# Patient Record
Sex: Female | Born: 1975 | Race: Black or African American | Hispanic: No | Marital: Married | Smoking: Current every day smoker
Health system: Southern US, Community
[De-identification: ages and names within clinical notes are randomized; demographics above are authoritative.]

## PROBLEM LIST (undated history)

## (undated) DIAGNOSIS — Z720 Tobacco use: Secondary | ICD-10-CM

## (undated) DIAGNOSIS — I10 Essential (primary) hypertension: Secondary | ICD-10-CM

## (undated) DIAGNOSIS — F32A Depression, unspecified: Secondary | ICD-10-CM

## (undated) DIAGNOSIS — D649 Anemia, unspecified: Secondary | ICD-10-CM

## (undated) DIAGNOSIS — F419 Anxiety disorder, unspecified: Secondary | ICD-10-CM

## (undated) DIAGNOSIS — D219 Benign neoplasm of connective and other soft tissue, unspecified: Secondary | ICD-10-CM

## (undated) HISTORY — DX: Tobacco use: Z72.0

---

## 2010-08-26 DIAGNOSIS — D219 Benign neoplasm of connective and other soft tissue, unspecified: Secondary | ICD-10-CM

## 2010-08-26 HISTORY — DX: Benign neoplasm of connective and other soft tissue, unspecified: D21.9

## 2011-02-20 ENCOUNTER — Other Ambulatory Visit: Payer: Self-pay | Admitting: Obstetrics and Gynecology

## 2011-02-20 DIAGNOSIS — N6310 Unspecified lump in the right breast, unspecified quadrant: Secondary | ICD-10-CM

## 2011-03-01 ENCOUNTER — Other Ambulatory Visit: Payer: Self-pay

## 2011-03-12 ENCOUNTER — Ambulatory Visit
Admission: RE | Admit: 2011-03-12 | Discharge: 2011-03-12 | Disposition: A | Discharge status: Home / Self Care | Payer: BC Managed Care – PPO | Source: Ambulatory Visit | Attending: Obstetrics and Gynecology | Admitting: Obstetrics and Gynecology

## 2011-03-12 ENCOUNTER — Other Ambulatory Visit: Payer: Self-pay | Admitting: Obstetrics and Gynecology

## 2011-03-12 DIAGNOSIS — N6310 Unspecified lump in the right breast, unspecified quadrant: Secondary | ICD-10-CM

## 2011-03-13 ENCOUNTER — Other Ambulatory Visit: Payer: BC Managed Care – PPO

## 2011-03-22 ENCOUNTER — Inpatient Hospital Stay: Admission: RE | Admit: 2011-03-22 | Payer: BC Managed Care – PPO | Source: Ambulatory Visit

## 2011-03-29 ENCOUNTER — Ambulatory Visit
Admission: RE | Admit: 2011-03-29 | Discharge: 2011-03-29 | Disposition: A | Discharge status: Home / Self Care | Payer: BC Managed Care – PPO | Source: Ambulatory Visit | Attending: Obstetrics and Gynecology | Admitting: Obstetrics and Gynecology

## 2011-03-29 ENCOUNTER — Other Ambulatory Visit: Payer: Self-pay | Admitting: Diagnostic Radiology

## 2011-03-29 DIAGNOSIS — N6310 Unspecified lump in the right breast, unspecified quadrant: Secondary | ICD-10-CM

## 2014-08-30 ENCOUNTER — Other Ambulatory Visit: Payer: Self-pay | Admitting: Obstetrics

## 2014-08-30 DIAGNOSIS — N63 Unspecified lump in unspecified breast: Secondary | ICD-10-CM

## 2014-09-06 ENCOUNTER — Other Ambulatory Visit (HOSPITAL_COMMUNITY): Payer: Self-pay | Admitting: Obstetrics

## 2014-09-06 DIAGNOSIS — R102 Pelvic and perineal pain: Secondary | ICD-10-CM

## 2014-09-07 ENCOUNTER — Ambulatory Visit
Admission: RE | Admit: 2014-09-07 | Discharge: 2014-09-07 | Disposition: A | Discharge status: Home / Self Care | Payer: Medicaid Other | Source: Ambulatory Visit | Attending: Obstetrics | Admitting: Obstetrics

## 2014-09-07 DIAGNOSIS — N63 Unspecified lump in unspecified breast: Secondary | ICD-10-CM

## 2014-09-09 ENCOUNTER — Ambulatory Visit (HOSPITAL_COMMUNITY)
Admission: RE | Admit: 2014-09-09 | Discharge: 2014-09-09 | Disposition: A | Discharge status: Home / Self Care | Payer: Medicaid Other | Source: Ambulatory Visit | Attending: Obstetrics | Admitting: Obstetrics

## 2014-09-09 DIAGNOSIS — R102 Pelvic and perineal pain: Secondary | ICD-10-CM | POA: Insufficient documentation

## 2014-09-09 DIAGNOSIS — N852 Hypertrophy of uterus: Secondary | ICD-10-CM | POA: Diagnosis not present

## 2014-09-26 ENCOUNTER — Other Ambulatory Visit: Payer: Self-pay | Admitting: Obstetrics

## 2014-09-26 DIAGNOSIS — N938 Other specified abnormal uterine and vaginal bleeding: Secondary | ICD-10-CM

## 2014-10-04 ENCOUNTER — Ambulatory Visit
Admission: RE | Admit: 2014-10-04 | Discharge: 2014-10-04 | Disposition: A | Discharge status: Home / Self Care | Payer: Medicaid Other | Source: Ambulatory Visit | Attending: Obstetrics | Admitting: Obstetrics

## 2014-10-04 DIAGNOSIS — N938 Other specified abnormal uterine and vaginal bleeding: Secondary | ICD-10-CM

## 2014-10-04 HISTORY — PX: IR GENERIC HISTORICAL: IMG1180011

## 2014-10-04 HISTORY — DX: Anemia, unspecified: D64.9

## 2014-10-04 HISTORY — DX: Benign neoplasm of connective and other soft tissue, unspecified: D21.9

## 2014-10-04 NOTE — Consult Note (Signed)
Chief Complaint: Chief Complaint  Patient presents with  . Fibroids    Consult for [place]    Referring Physician(s): Marshall,Bernard A  History of Present Illness: Pamela Pace is a 39 y.o. female with a long history of menorrhagia and pelvic cramping. She also describes pelvic bulk symptoms such as fullness and constipation. She just recently received a depo provera injection, three weeks ago, and has improvement in the bleeding symptoms. Her Hb was reportedly 9. She has no future pregnancy plans. She denies a history of PID, pelvic radiation, and pelvic malignancy. She denies previous intervention for fibroids.  Past Medical History  Diagnosis Date  . Fibroids 2012  . Anemia   HTN Gestational HTN and DM  Past Surgical History  Procedure Laterality Date  . Cesarean section  2008    Allergies: Other, all seafood/  Medications: Prior to Admission medications   Medication Sig Start Date End Date Taking? Authorizing Provider  chlorthalidone (HYGROTEN) 100 MG tablet Take 12.5 mg by mouth daily.   Yes Historical Provider, MD    No family history on file.  History   Social History  . Marital Status: Married    Spouse Name: N/A    Number of Children: N/A  . Years of Education: N/A   Social History Main Topics  . Smoking status: Light Tobacco Smoker  . Smokeless tobacco: Not on file  . Alcohol Use: Not on file  . Drug Use: Not on file  . Sexual Activity: Not on file   Other Topics Concern  . Not on file   Social History Narrative  . No narrative on file     Review of Systems: A 12 point ROS discussed and pertinent positives are indicated in the HPI above.  All other systems are negative.  Review of Systems  Vital Signs: BP 131/76 mmHg  Pulse 85  Temp(Src) 98.5 F (36.9 C) (Oral)  Resp 14  Ht 5\' 3"  (1.6 m)  Wt 148 lb (67.132 kg)  BMI 26.22 kg/m2  SpO2 100%  LMP 08/24/2014 (Exact Date)  Physical Exam  Constitutional: She is oriented to person,  place, and time. She appears well-developed and well-nourished.  HENT:  Head: Normocephalic and atraumatic.  Cardiovascular: Normal rate, regular rhythm and normal heart sounds.   Pulmonary/Chest: Effort normal and breath sounds normal.  Abdominal: Soft.  Musculoskeletal: Normal range of motion.  R fem/DP/PT pulses 2+  Neurological: She is alert and oriented to person, place, and time.  Skin: Skin is warm and dry.    Imaging: US Transvaginal Non-ob  09/09/2014   CLINICAL DATA:  Pelvic pain, enlarged uterus at physical exam  EXAM: TRANSABDOMINAL AND TRANSVAGINAL ULTRASOUND OF PELVIS  TECHNIQUE: Both transabdominal and transvaginal ultrasound examinations of the pelvis were performed. Transabdominal technique was performed for global imaging of the pelvis including uterus, ovaries, adnexal regions, and pelvic cul-de-sac. It was necessary to proceed with endovaginal exam following the transabdominal exam to visualize the endometrium and ovaries.  COMPARISON:  None  FINDINGS: Uterus  Measurements: 14.7 x 9.2 x 12 cm. Anteverted, retroflexed. Multiple uterine fibroids are noted, rendering visualization of the uterine fundus suboptimal. Left lateral uterine body intramural fibroid measures 6.0 x 5.8 x 5.6 cm.  Right fundal intramural fibroid measures 6.0 x 5.5 x 5.0 cm.  Right lateral uterine body fibroid measures 7.1 x 6.0 x 5.6 cm.  Endometrium  Thickness: 4 mm.  No focal abnormality visualized.  Right ovary  Measurements: 2.3 x 2.1 x 1.5 cm.  Normal appearance/no adnexal mass.  Left ovary  Measurements: 4.6 x 4.0 x 3.3 cm. Probable resolving hemorrhagic cyst with cobweb like peripheral retractile internal echoes measures 3.0 x 2.9 x 2.4 cm, likely incidental unless the patient has left-sided pelvic pain.  Other findings  No free fluid  IMPRESSION: Uterine fibroids as above.  Likely incidental left ovarian cyst. If the patient has left-sided pelvic pain, consider reimaging in 4-6 weeks during the week  following the patient's menses to document resolution.   Electronically Signed   By: Conchita Paris M.D.   On: 09/09/2014 15:02   US Pelvis Complete  09/09/2014   CLINICAL DATA:  Pelvic pain, enlarged uterus at physical exam  EXAM: TRANSABDOMINAL AND TRANSVAGINAL ULTRASOUND OF PELVIS  TECHNIQUE: Both transabdominal and transvaginal ultrasound examinations of the pelvis were performed. Transabdominal technique was performed for global imaging of the pelvis including uterus, ovaries, adnexal regions, and pelvic cul-de-sac. It was necessary to proceed with endovaginal exam following the transabdominal exam to visualize the endometrium and ovaries.  COMPARISON:  None  FINDINGS: Uterus  Measurements: 14.7 x 9.2 x 12 cm. Anteverted, retroflexed. Multiple uterine fibroids are noted, rendering visualization of the uterine fundus suboptimal. Left lateral uterine body intramural fibroid measures 6.0 x 5.8 x 5.6 cm.  Right fundal intramural fibroid measures 6.0 x 5.5 x 5.0 cm.  Right lateral uterine body fibroid measures 7.1 x 6.0 x 5.6 cm.  Endometrium  Thickness: 4 mm.  No focal abnormality visualized.  Right ovary  Measurements: 2.3 x 2.1 x 1.5 cm. Normal appearance/no adnexal mass.  Left ovary  Measurements: 4.6 x 4.0 x 3.3 cm. Probable resolving hemorrhagic cyst with cobweb like peripheral retractile internal echoes measures 3.0 x 2.9 x 2.4 cm, likely incidental unless the patient has left-sided pelvic pain.  Other findings  No free fluid  IMPRESSION: Uterine fibroids as above.  Likely incidental left ovarian cyst. If the patient has left-sided pelvic pain, consider reimaging in 4-6 weeks during the week following the patient's menses to document resolution.   Electronically Signed   By: Conchita Paris M.D.   On: 09/09/2014 15:02   Mm Digital Diagnostic Bilat  09/07/2014   CLINICAL DATA:  Follow-up benign right breast biopsy and annual  EXAM: DIGITAL DIAGNOSTIC  BILATERAL MAMMOGRAM WITH CAD  ULTRASOUND RIGHT  BREAST  COMPARISON:  03/12/2011  ACR Breast Density Category c: The breast tissue is heterogeneously dense, which may obscure small masses.  FINDINGS: A metallic biopsy marker clip is noted within the subareolar right breast. A few scattered benign-appearing calcifications are noted bilaterally. An asymmetry identified in the medial right breast persists on additional imaging and may correspond to a vessel. The breasts are without definite suspicious mass, architectural distortion or malignant type calcifications.  Mammographic images were processed with CAD.  On physical exam, no suspicious mass is palpated on examination.  A targeted right breast ultrasound was performed of the medial right breast which demonstrates only normal fibroglandular tissue. No suspicious mass, shadowing or fluid collection was identified.  IMPRESSION: No mammographic or sonographic evidence of malignancy.  RECOMMENDATION: Recommend annual screening mammograms. The patient should return sooner if clinically indicated.  I have discussed the findings and recommendations with the patient. Results were also provided in writing at the conclusion of the visit. If applicable, a reminder letter will be sent to the patient regarding the next appointment.  BI-RADS CATEGORY  2: Benign.   Electronically Signed   By: Rosemarie Ax   On: 09/07/2014 07:59  US Breast Ltd Uni Right Inc Axilla  09/07/2014   CLINICAL DATA:  Follow-up benign right breast biopsy and annual  EXAM: DIGITAL DIAGNOSTIC  BILATERAL MAMMOGRAM WITH CAD  ULTRASOUND RIGHT BREAST  COMPARISON:  03/12/2011  ACR Breast Density Category c: The breast tissue is heterogeneously dense, which may obscure small masses.  FINDINGS: A metallic biopsy marker clip is noted within the subareolar right breast. A few scattered benign-appearing calcifications are noted bilaterally. An asymmetry identified in the medial right breast persists on additional imaging and may correspond to a vessel. The  breasts are without definite suspicious mass, architectural distortion or malignant type calcifications.  Mammographic images were processed with CAD.  On physical exam, no suspicious mass is palpated on examination.  A targeted right breast ultrasound was performed of the medial right breast which demonstrates only normal fibroglandular tissue. No suspicious mass, shadowing or fluid collection was identified.  IMPRESSION: No mammographic or sonographic evidence of malignancy.  RECOMMENDATION: Recommend annual screening mammograms. The patient should return sooner if clinically indicated.  I have discussed the findings and recommendations with the patient. Results were also provided in writing at the conclusion of the visit. If applicable, a reminder letter will be sent to the patient regarding the next appointment.  BI-RADS CATEGORY  2: Benign.   Electronically Signed   By: Rosemarie Ax   On: 09/07/2014 07:59    Labs:  CBC: No results for input(s): WBC, HGB, HCT, PLT in the last 8760 hours.  COAGS: No results for input(s): INR, APTT in the last 8760 hours.  BMP: No results for input(s): NA, K, CL, CO2, GLUCOSE, BUN, CALCIUM, CREATININE, GFRNONAA, GFRAA in the last 8760 hours.  Invalid input(s): CMP  LIVER FUNCTION TESTS: No results for input(s): BILITOT, AST, ALT, ALKPHOS, PROT, ALBUMIN in the last 8760 hours.  TUMOR MARKERS: No results for input(s): AFPTM, CEA, CA199, CHROMGRNA in the last 8760 hours.  Assessment and Plan:  Mrs. Bronaugh has symptomatic fibroids, by [place], and is a candidate for [place]. She consents to progressing through the workup for [place] with an MRI. Her questions were answered.  Thank you for this interesting consult.  I greatly enjoyed meeting Theola Cuellar and look forward to participating in their care.  Signed: Danahi Reddish, ART A 10/04/2014, 3:43 PM   I spent a total of 45 minutes face to face in clinical consultation, greater than 50% of which was counseling/coordinating  care for [place]

## 2014-10-12 ENCOUNTER — Other Ambulatory Visit: Payer: Self-pay | Admitting: Obstetrics

## 2014-10-12 DIAGNOSIS — D259 Leiomyoma of uterus, unspecified: Secondary | ICD-10-CM

## 2014-10-22 ENCOUNTER — Ambulatory Visit
Admission: RE | Admit: 2014-10-22 | Discharge: 2014-10-22 | Disposition: A | Discharge status: Home / Self Care | Payer: Medicaid Other | Source: Ambulatory Visit | Attending: Obstetrics | Admitting: Obstetrics

## 2014-10-22 DIAGNOSIS — D259 Leiomyoma of uterus, unspecified: Secondary | ICD-10-CM

## 2014-10-22 MED ORDER — GADOBENATE DIMEGLUMINE 529 MG/ML IV SOLN
13.0000 mL | Freq: Once | INTRAVENOUS | Status: AC | PRN
  Administered 2014-10-22: 13 mL via INTRAVENOUS

## 2014-12-12 ENCOUNTER — Other Ambulatory Visit: Payer: Self-pay | Admitting: Radiology

## 2014-12-13 ENCOUNTER — Other Ambulatory Visit: Payer: Self-pay | Admitting: Radiology

## 2014-12-14 ENCOUNTER — Ambulatory Visit (HOSPITAL_COMMUNITY)
Admission: RE | Admit: 2014-12-14 | Discharge: 2014-12-14 | Disposition: A | Discharge status: Home / Self Care | Payer: Medicaid Other | Source: Ambulatory Visit | Attending: Interventional Radiology | Admitting: Interventional Radiology

## 2014-12-14 ENCOUNTER — Encounter (HOSPITAL_COMMUNITY): Payer: Self-pay

## 2014-12-14 ENCOUNTER — Observation Stay (HOSPITAL_COMMUNITY)
Admission: RE | Admit: 2014-12-14 | Discharge: 2014-12-15 | Disposition: A | Discharge status: Home / Self Care | Payer: Medicaid Other | Source: Ambulatory Visit | Attending: Interventional Radiology | Admitting: Interventional Radiology

## 2014-12-14 VITALS — BP 117/76 | HR 80 | Temp 98.5°F | Resp 16 | Ht 63.0 in | Wt 152.0 lb

## 2014-12-14 DIAGNOSIS — D259 Leiomyoma of uterus, unspecified: Secondary | ICD-10-CM | POA: Diagnosis not present

## 2014-12-14 DIAGNOSIS — F17210 Nicotine dependence, cigarettes, uncomplicated: Secondary | ICD-10-CM | POA: Diagnosis not present

## 2014-12-14 DIAGNOSIS — D219 Benign neoplasm of connective and other soft tissue, unspecified: Secondary | ICD-10-CM | POA: Diagnosis present

## 2014-12-14 DIAGNOSIS — N920 Excessive and frequent menstruation with regular cycle: Secondary | ICD-10-CM | POA: Diagnosis present

## 2014-12-14 LAB — CBC WITH DIFFERENTIAL/PLATELET
Basophils Absolute: 0 10*3/uL (ref 0.0–0.1)
Basophils Relative: 0 % (ref 0–1)
Eosinophils Absolute: 0.1 10*3/uL (ref 0.0–0.7)
Eosinophils Relative: 2 % (ref 0–5)
HEMATOCRIT: 39.2 % (ref 36.0–46.0)
HEMOGLOBIN: 12.2 g/dL (ref 12.0–15.0)
LYMPHS ABS: 2.6 10*3/uL (ref 0.7–4.0)
Lymphocytes Relative: 38 % (ref 12–46)
MCH: 25.5 pg — ABNORMAL LOW (ref 26.0–34.0)
MCHC: 31.1 g/dL (ref 30.0–36.0)
MCV: 82 fL (ref 78.0–100.0)
MONOS PCT: 7 % (ref 3–12)
Monocytes Absolute: 0.5 10*3/uL (ref 0.1–1.0)
Neutro Abs: 3.7 10*3/uL (ref 1.7–7.7)
Neutrophils Relative %: 53 % (ref 43–77)
Platelets: 338 10*3/uL (ref 150–400)
RBC: 4.78 MIL/uL (ref 3.87–5.11)
RDW: 20 % — ABNORMAL HIGH (ref 11.5–15.5)
WBC: 6.9 10*3/uL (ref 4.0–10.5)

## 2014-12-14 LAB — BASIC METABOLIC PANEL
ANION GAP: 10 (ref 5–15)
BUN: 12 mg/dL (ref 6–23)
CHLORIDE: 104 mmol/L (ref 96–112)
CO2: 25 mmol/L (ref 19–32)
Calcium: 8.8 mg/dL (ref 8.4–10.5)
Creatinine, Ser: 0.71 mg/dL (ref 0.50–1.10)
GFR calc non Af Amer: >90 mL/min (ref >90)
Glucose, Bld: 101 mg/dL — ABNORMAL HIGH (ref 70–99)
POTASSIUM: 3 mmol/L — AB (ref 3.5–5.1)
SODIUM: 139 mmol/L (ref 135–145)

## 2014-12-14 LAB — HCG, SERUM, QUALITATIVE: PREG SERUM: NEGATIVE

## 2014-12-14 LAB — APTT: APTT: 28 s (ref 24–37)

## 2014-12-14 LAB — PROTIME-INR
INR: 0.95 (ref 0.00–1.49)
Prothrombin Time: 12.8 seconds (ref 11.6–15.2)

## 2014-12-14 MED ORDER — IOHEXOL 300 MG/ML  SOLN
50.0000 mL | Freq: Once | INTRAMUSCULAR | Status: AC | PRN
  Administered 2014-12-14: 1 mL via INTRA_ARTERIAL

## 2014-12-14 MED ORDER — NITROGLYCERIN 1 MG/10 ML FOR IR/CATH LAB
INTRA_ARTERIAL | Status: AC | PRN
  Administered 2014-12-14: 100 ug via INTRA_ARTERIAL

## 2014-12-14 MED ORDER — LIDOCAINE HCL 1 % IJ SOLN
INTRAMUSCULAR | Status: AC
  Filled 2014-12-14: qty 20

## 2014-12-14 MED ORDER — ADULT MULTIVITAMIN W/MINERALS CH
1.0000 | ORAL_TABLET | Freq: Every day | ORAL | Status: DC
  Administered 2014-12-14 – 2014-12-15 (×2): 1 via ORAL
  Filled 2014-12-14 (×2): qty 1

## 2014-12-14 MED ORDER — POTASSIUM CHLORIDE 10 MEQ/100ML IV SOLN
10.0000 meq | INTRAVENOUS | Status: AC
  Administered 2014-12-14 (×2): 10 meq via INTRAVENOUS
  Filled 2014-12-14 (×2): qty 100

## 2014-12-14 MED ORDER — SODIUM CHLORIDE 0.9 % IJ SOLN
3.0000 mL | Freq: Two times a day (BID) | INTRAMUSCULAR | Status: DC
  Administered 2014-12-14 – 2014-12-15 (×2): 3 mL via INTRAVENOUS

## 2014-12-14 MED ORDER — MIDAZOLAM HCL 2 MG/2ML IJ SOLN
INTRAMUSCULAR | Status: AC | PRN
  Administered 2014-12-14 (×4): 0.5 mg via INTRAVENOUS
  Administered 2014-12-14: 1 mg via INTRAVENOUS
  Administered 2014-12-14 (×2): 0.5 mg via INTRAVENOUS

## 2014-12-14 MED ORDER — DIPHENHYDRAMINE HCL 50 MG PO CAPS
50.0000 mg | ORAL_CAPSULE | Freq: Once | ORAL | Status: AC
  Administered 2014-12-14: 50 mg via ORAL
  Filled 2014-12-14: qty 1

## 2014-12-14 MED ORDER — HYDROMORPHONE 0.3 MG/ML IV SOLN
INTRAVENOUS | Status: DC
  Administered 2014-12-14: 12:00:00 via INTRAVENOUS

## 2014-12-14 MED ORDER — CHLORTHALIDONE 25 MG PO TABS
12.5000 mg | ORAL_TABLET | Freq: Every day | ORAL | Status: DC
  Administered 2014-12-14: 12.5 mg via ORAL
  Filled 2014-12-14 (×2): qty 0.5

## 2014-12-14 MED ORDER — ONDANSETRON HCL 4 MG/2ML IJ SOLN: 4.0000 mg | Freq: Four times a day (QID) | INTRAMUSCULAR | Status: DC | PRN

## 2014-12-14 MED ORDER — MIDAZOLAM HCL 2 MG/2ML IJ SOLN
INTRAMUSCULAR | Status: AC
  Filled 2014-12-14: qty 6

## 2014-12-14 MED ORDER — CHLORHEXIDINE GLUCONATE 0.12 % MT SOLN
15.0000 mL | Freq: Two times a day (BID) | OROMUCOSAL | Status: DC
Start: 2014-12-14 — End: 2014-12-15
  Administered 2014-12-14 – 2014-12-15 (×2): 15 mL via OROMUCOSAL
  Filled 2014-12-14 (×4): qty 15

## 2014-12-14 MED ORDER — CEFAZOLIN SODIUM-DEXTROSE 2-3 GM-% IV SOLR
2.0000 g | INTRAVENOUS | Status: AC
  Administered 2014-12-14: 2 g via INTRAVENOUS
  Filled 2014-12-14: qty 50

## 2014-12-14 MED ORDER — HYDROMORPHONE 0.3 MG/ML IV SOLN
INTRAVENOUS | Status: AC
  Filled 2014-12-14: qty 25

## 2014-12-14 MED ORDER — DOCUSATE SODIUM 100 MG PO CAPS
100.0000 mg | ORAL_CAPSULE | Freq: Two times a day (BID) | ORAL | Status: DC
  Administered 2014-12-14 – 2014-12-15 (×2): 100 mg via ORAL
  Filled 2014-12-14 (×2): qty 1

## 2014-12-14 MED ORDER — NALOXONE HCL 0.4 MG/ML IJ SOLN: 0.4000 mg | INTRAMUSCULAR | Status: DC | PRN

## 2014-12-14 MED ORDER — PROMETHAZINE HCL 25 MG PO TABS: 25.0000 mg | ORAL_TABLET | Freq: Three times a day (TID) | ORAL | Status: DC | PRN

## 2014-12-14 MED ORDER — SODIUM CHLORIDE 0.9 % IJ SOLN: 3.0000 mL | INTRAMUSCULAR | Status: DC | PRN

## 2014-12-14 MED ORDER — PROMETHAZINE HCL 25 MG RE SUPP: 25.0000 mg | Freq: Three times a day (TID) | RECTAL | Status: DC | PRN

## 2014-12-14 MED ORDER — FERROUS FUMARATE 325 (106 FE) MG PO TABS
1.0000 | ORAL_TABLET | Freq: Every day | ORAL | Status: DC
  Administered 2014-12-14 – 2014-12-15 (×2): 106 mg via ORAL
  Filled 2014-12-14 (×2): qty 1

## 2014-12-14 MED ORDER — SODIUM CHLORIDE 0.9 % IV SOLN
INTRAVENOUS | Status: DC
  Administered 2014-12-14: 08:00:00 via INTRAVENOUS

## 2014-12-14 MED ORDER — SODIUM CHLORIDE 0.9 % IJ SOLN: 9.0000 mL | INTRAMUSCULAR | Status: DC | PRN

## 2014-12-14 MED ORDER — FENTANYL CITRATE (PF) 100 MCG/2ML IJ SOLN
INTRAMUSCULAR | Status: AC
  Filled 2014-12-14: qty 4

## 2014-12-14 MED ORDER — KETOROLAC TROMETHAMINE 30 MG/ML IJ SOLN
30.0000 mg | Freq: Once | INTRAMUSCULAR | Status: AC
  Administered 2014-12-14: 30 mg via INTRAVENOUS
  Filled 2014-12-14: qty 1

## 2014-12-14 MED ORDER — DIPHENHYDRAMINE HCL 50 MG/ML IJ SOLN: 12.5000 mg | Freq: Four times a day (QID) | INTRAMUSCULAR | Status: DC | PRN

## 2014-12-14 MED ORDER — FENTANYL CITRATE (PF) 100 MCG/2ML IJ SOLN
INTRAMUSCULAR | Status: AC | PRN
  Administered 2014-12-14 (×3): 25 ug via INTRAVENOUS
  Administered 2014-12-14: 50 ug via INTRAVENOUS
  Administered 2014-12-14: 25 ug via INTRAVENOUS

## 2014-12-14 MED ORDER — DIPHENHYDRAMINE HCL 12.5 MG/5ML PO ELIX
12.5000 mg | ORAL_SOLUTION | Freq: Four times a day (QID) | ORAL | Status: DC | PRN
  Filled 2014-12-14: qty 5

## 2014-12-14 MED ORDER — VITAMIN D3 25 MCG (1000 UNIT) PO TABS
1000.0000 [IU] | ORAL_TABLET | Freq: Every day | ORAL | Status: DC
  Administered 2014-12-14 – 2014-12-15 (×2): 1000 [IU] via ORAL
  Filled 2014-12-14 (×2): qty 1

## 2014-12-14 MED ORDER — CETYLPYRIDINIUM CHLORIDE 0.05 % MT LIQD
7.0000 mL | Freq: Two times a day (BID) | OROMUCOSAL | Status: DC
  Administered 2014-12-14: 7 mL via OROMUCOSAL

## 2014-12-14 MED ORDER — IBUPROFEN 800 MG PO TABS
800.0000 mg | ORAL_TABLET | Freq: Four times a day (QID) | ORAL | Status: DC
  Administered 2014-12-14 – 2014-12-15 (×5): 800 mg via ORAL
  Filled 2014-12-14 (×6): qty 1
  Filled 2014-12-14: qty 4

## 2014-12-14 MED ORDER — SODIUM CHLORIDE 0.9 % IV SOLN: 250.0000 mL | INTRAVENOUS | Status: DC | PRN

## 2014-12-14 MED ORDER — SODIUM CHLORIDE 0.9 % IV SOLN
INTRAVENOUS | Status: DC
  Administered 2014-12-14: 09:00:00 via INTRAVENOUS

## 2014-12-14 NOTE — Progress Notes (Signed)
93ml High concentration dose dilaudid PCA wasted in sink with Jeanie Cooks, RN.

## 2014-12-14 NOTE — Progress Notes (Signed)
Foley cath removed. Patient tolerated well, no discomfort at this time.

## 2014-12-14 NOTE — Progress Notes (Signed)
Subjective: Pt c/o itching from "head to toe"; denies pelvic pain,CP, dyspnea, N/V; lost IV access  Objective: Vital signs in last 24 hours: Temp:  [97.8 F (36.6 C)] 97.8 F (36.6 C) (04/20 1513) Pulse Rate:  [73-86] 77 (04/20 1513) Resp:  [10-19] 16 (04/20 1513) BP: (116-147)/(57-85) 119/57 mmHg (04/20 1513) SpO2:  [99 %-100 %] 100 % (04/20 1513)    Intake/Output from previous day:   Intake/Output this shift:    abd soft,+BS, NT, fibroid uterus; intact distal pulses; rt groin puncture site clean and dry,NT,no hematoma  Lab Results:   Recent Labs  12/14/14 0753  WBC 6.9  HGB 12.2  HCT 39.2  PLT 338   BMET  Recent Labs  12/14/14 0753  NA 139  K 3.0*  CL 104  CO2 25  GLUCOSE 101*  BUN 12  CREATININE 0.71  CALCIUM 8.8   PT/INR  Recent Labs  12/14/14 0753  LABPROT 12.8  INR 0.95   ABG No results for input(s): PHART, HCO3 in the last 72 hours.  Invalid input(s): PCO2, PO2  Studies/Results: Ir Angiogram Pelvis Selective Or Supraselective  12/14/2014   CLINICAL DATA:  Uterine fibroids  EXAM: UTERINE ARTERY EMBOLIZATION  FLUOROSCOPY TIME:  32 minutes and 12 seconds.  MEDICATIONS AND MEDICAL HISTORY: Versed 4 mg, Fentanyl 150 mcg.  As antibiotic prophylaxis, Ancef was ordered pre-procedure and administered intravenously within one hour of incision.  Allergies: Seafood  ANESTHESIA/SEDATION: Moderate sedation time: 82 minutes  CONTRAST:  50 cc Omnipaque 300  PROCEDURE: Vessels selected:  Iliac artery third order bilaterally.  The procedure, risks, benefits, and alternatives were explained to the patient. Questions regarding the procedure were encouraged and answered. The patient understands and consents to the procedure.  The right groin was prepped with Betadine in a sterile fashion, and a sterile drape was applied covering the operative field. A sterile gown and sterile gloves were used for the procedure.  A 19 gauge needle was inserted into the right common  femoral artery and removed over a Benson. A 5-French sheath was inserted. Cobra II catheter was advanced into the aorta. The contralateral left common iliac artery was selected. The internal iliac artery on the left was selected. 3D angiography was performed. A micro catheter was advanced over an 018 glide wire into the left uterine artery. Angiography was performed.  Embolization was performed utilizing2 vials 500-700 micron embospheres and 2/3 vial 700-900 micron embospheres.  Repeat angiogram was performed.  The micro catheter was removed and flushed. A Waltman's loop was created. The ipsilateral right common iliac artery and right internal iliac artery were selected. 3D angio was performed. A micro catheter was advanced over a 0018 glide wire into the right uterine artery. Angiography was performed.  Embolization was again performed with2 vials 500-700 micron embospheres.  Followup angiography was performed.  The micro catheter and Cobra catheter were removed. The sheath was removed. Hemostasis was achieved with direct pressure.  FINDINGS: Imaging demonstrates bilateral pelvic anatomy and bilateral uterine artery angiography. Expected anatomy is identified. No blood supply from the uterine artery to the vagina is visualized.  Post embolization angiography demonstrates relative stasis of flow and pruning of the vasculature to the uterus.  COMPLICATIONS: None  IMPRESSION: Successful bilateral uterine artery embolization.   Electronically Signed   By: Marybelle Killings M.D.   On: 12/14/2014 16:39   Ir Angiogram Pelvis Selective Or Supraselective  12/14/2014   CLINICAL DATA:  Uterine fibroids  EXAM: UTERINE ARTERY EMBOLIZATION  FLUOROSCOPY TIME:  32 minutes and 12 seconds.  MEDICATIONS AND MEDICAL HISTORY: Versed 4 mg, Fentanyl 150 mcg.  As antibiotic prophylaxis, Ancef was ordered pre-procedure and administered intravenously within one hour of incision.  Allergies: Seafood  ANESTHESIA/SEDATION: Moderate sedation  time: 82 minutes  CONTRAST:  50 cc Omnipaque 300  PROCEDURE: Vessels selected:  Iliac artery third order bilaterally.  The procedure, risks, benefits, and alternatives were explained to the patient. Questions regarding the procedure were encouraged and answered. The patient understands and consents to the procedure.  The right groin was prepped with Betadine in a sterile fashion, and a sterile drape was applied covering the operative field. A sterile gown and sterile gloves were used for the procedure.  A 19 gauge needle was inserted into the right common femoral artery and removed over a Benson. A 5-French sheath was inserted. Cobra II catheter was advanced into the aorta. The contralateral left common iliac artery was selected. The internal iliac artery on the left was selected. 3D angiography was performed. A micro catheter was advanced over an 018 glide wire into the left uterine artery. Angiography was performed.  Embolization was performed utilizing2 vials 500-700 micron embospheres and 2/3 vial 700-900 micron embospheres.  Repeat angiogram was performed.  The micro catheter was removed and flushed. A Waltman's loop was created. The ipsilateral right common iliac artery and right internal iliac artery were selected. 3D angio was performed. A micro catheter was advanced over a 0018 glide wire into the right uterine artery. Angiography was performed.  Embolization was again performed with2 vials 500-700 micron embospheres.  Followup angiography was performed.  The micro catheter and Cobra catheter were removed. The sheath was removed. Hemostasis was achieved with direct pressure.  FINDINGS: Imaging demonstrates bilateral pelvic anatomy and bilateral uterine artery angiography. Expected anatomy is identified. No blood supply from the uterine artery to the vagina is visualized.  Post embolization angiography demonstrates relative stasis of flow and pruning of the vasculature to the uterus.  COMPLICATIONS: None   IMPRESSION: Successful bilateral uterine artery embolization.   Electronically Signed   By: Marybelle Killings M.D.   On: 12/14/2014 16:39   Ir Angiogram Selective Each Additional Vessel  12/14/2014   CLINICAL DATA:  Uterine fibroids  EXAM: UTERINE ARTERY EMBOLIZATION  FLUOROSCOPY TIME:  32 minutes and 12 seconds.  MEDICATIONS AND MEDICAL HISTORY: Versed 4 mg, Fentanyl 150 mcg.  As antibiotic prophylaxis, Ancef was ordered pre-procedure and administered intravenously within one hour of incision.  Allergies: Seafood  ANESTHESIA/SEDATION: Moderate sedation time: 82 minutes  CONTRAST:  50 cc Omnipaque 300  PROCEDURE: Vessels selected:  Iliac artery third order bilaterally.  The procedure, risks, benefits, and alternatives were explained to the patient. Questions regarding the procedure were encouraged and answered. The patient understands and consents to the procedure.  The right groin was prepped with Betadine in a sterile fashion, and a sterile drape was applied covering the operative field. A sterile gown and sterile gloves were used for the procedure.  A 19 gauge needle was inserted into the right common femoral artery and removed over a Benson. A 5-French sheath was inserted. Cobra II catheter was advanced into the aorta. The contralateral left common iliac artery was selected. The internal iliac artery on the left was selected. 3D angiography was performed. A micro catheter was advanced over an 018 glide wire into the left uterine artery. Angiography was performed.  Embolization was performed utilizing2 vials 500-700 micron embospheres and 2/3 vial 700-900 micron embospheres.  Repeat angiogram was  performed.  The micro catheter was removed and flushed. A Waltman's loop was created. The ipsilateral right common iliac artery and right internal iliac artery were selected. 3D angio was performed. A micro catheter was advanced over a 0018 glide wire into the right uterine artery. Angiography was performed.  Embolization  was again performed with2 vials 500-700 micron embospheres.  Followup angiography was performed.  The micro catheter and Cobra catheter were removed. The sheath was removed. Hemostasis was achieved with direct pressure.  FINDINGS: Imaging demonstrates bilateral pelvic anatomy and bilateral uterine artery angiography. Expected anatomy is identified. No blood supply from the uterine artery to the vagina is visualized.  Post embolization angiography demonstrates relative stasis of flow and pruning of the vasculature to the uterus.  COMPLICATIONS: None  IMPRESSION: Successful bilateral uterine artery embolization.   Electronically Signed   By: Marybelle Killings M.D.   On: 12/14/2014 16:39   Ir Angiogram Selective Each Additional Vessel  12/14/2014   CLINICAL DATA:  Uterine fibroids  EXAM: UTERINE ARTERY EMBOLIZATION  FLUOROSCOPY TIME:  32 minutes and 12 seconds.  MEDICATIONS AND MEDICAL HISTORY: Versed 4 mg, Fentanyl 150 mcg.  As antibiotic prophylaxis, Ancef was ordered pre-procedure and administered intravenously within one hour of incision.  Allergies: Seafood  ANESTHESIA/SEDATION: Moderate sedation time: 82 minutes  CONTRAST:  50 cc Omnipaque 300  PROCEDURE: Vessels selected:  Iliac artery third order bilaterally.  The procedure, risks, benefits, and alternatives were explained to the patient. Questions regarding the procedure were encouraged and answered. The patient understands and consents to the procedure.  The right groin was prepped with Betadine in a sterile fashion, and a sterile drape was applied covering the operative field. A sterile gown and sterile gloves were used for the procedure.  A 19 gauge needle was inserted into the right common femoral artery and removed over a Benson. A 5-French sheath was inserted. Cobra II catheter was advanced into the aorta. The contralateral left common iliac artery was selected. The internal iliac artery on the left was selected. 3D angiography was performed. A micro  catheter was advanced over an 018 glide wire into the left uterine artery. Angiography was performed.  Embolization was performed utilizing2 vials 500-700 micron embospheres and 2/3 vial 700-900 micron embospheres.  Repeat angiogram was performed.  The micro catheter was removed and flushed. A Waltman's loop was created. The ipsilateral right common iliac artery and right internal iliac artery were selected. 3D angio was performed. A micro catheter was advanced over a 0018 glide wire into the right uterine artery. Angiography was performed.  Embolization was again performed with2 vials 500-700 micron embospheres.  Followup angiography was performed.  The micro catheter and Cobra catheter were removed. The sheath was removed. Hemostasis was achieved with direct pressure.  FINDINGS: Imaging demonstrates bilateral pelvic anatomy and bilateral uterine artery angiography. Expected anatomy is identified. No blood supply from the uterine artery to the vagina is visualized.  Post embolization angiography demonstrates relative stasis of flow and pruning of the vasculature to the uterus.  COMPLICATIONS: None  IMPRESSION: Successful bilateral uterine artery embolization.   Electronically Signed   By: Marybelle Killings M.D.   On: 12/14/2014 16:39   Ir 3d Primitivo Gauze Darreld Mclean  12/14/2014   CLINICAL DATA:  Uterine fibroids  EXAM: UTERINE ARTERY EMBOLIZATION  FLUOROSCOPY TIME:  32 minutes and 12 seconds.  MEDICATIONS AND MEDICAL HISTORY: Versed 4 mg, Fentanyl 150 mcg.  As antibiotic prophylaxis, Ancef was ordered pre-procedure and administered intravenously within one hour of  incision.  Allergies: Seafood  ANESTHESIA/SEDATION: Moderate sedation time: 82 minutes  CONTRAST:  50 cc Omnipaque 300  PROCEDURE: Vessels selected:  Iliac artery third order bilaterally.  The procedure, risks, benefits, and alternatives were explained to the patient. Questions regarding the procedure were encouraged and answered. The patient understands and  consents to the procedure.  The right groin was prepped with Betadine in a sterile fashion, and a sterile drape was applied covering the operative field. A sterile gown and sterile gloves were used for the procedure.  A 19 gauge needle was inserted into the right common femoral artery and removed over a Benson. A 5-French sheath was inserted. Cobra II catheter was advanced into the aorta. The contralateral left common iliac artery was selected. The internal iliac artery on the left was selected. 3D angiography was performed. A micro catheter was advanced over an 018 glide wire into the left uterine artery. Angiography was performed.  Embolization was performed utilizing2 vials 500-700 micron embospheres and 2/3 vial 700-900 micron embospheres.  Repeat angiogram was performed.  The micro catheter was removed and flushed. A Waltman's loop was created. The ipsilateral right common iliac artery and right internal iliac artery were selected. 3D angio was performed. A micro catheter was advanced over a 0018 glide wire into the right uterine artery. Angiography was performed.  Embolization was again performed with2 vials 500-700 micron embospheres.  Followup angiography was performed.  The micro catheter and Cobra catheter were removed. The sheath was removed. Hemostasis was achieved with direct pressure.  FINDINGS: Imaging demonstrates bilateral pelvic anatomy and bilateral uterine artery angiography. Expected anatomy is identified. No blood supply from the uterine artery to the vagina is visualized.  Post embolization angiography demonstrates relative stasis of flow and pruning of the vasculature to the uterus.  COMPLICATIONS: None  IMPRESSION: Successful bilateral uterine artery embolization.   Electronically Signed   By: Marybelle Killings M.D.   On: 12/14/2014 16:39   Ir US Guide Vasc Access Right  12/14/2014   CLINICAL DATA:  Uterine fibroids  EXAM: UTERINE ARTERY EMBOLIZATION  FLUOROSCOPY TIME:  32 minutes and 12  seconds.  MEDICATIONS AND MEDICAL HISTORY: Versed 4 mg, Fentanyl 150 mcg.  As antibiotic prophylaxis, Ancef was ordered pre-procedure and administered intravenously within one hour of incision.  Allergies: Seafood  ANESTHESIA/SEDATION: Moderate sedation time: 82 minutes  CONTRAST:  50 cc Omnipaque 300  PROCEDURE: Vessels selected:  Iliac artery third order bilaterally.  The procedure, risks, benefits, and alternatives were explained to the patient. Questions regarding the procedure were encouraged and answered. The patient understands and consents to the procedure.  The right groin was prepped with Betadine in a sterile fashion, and a sterile drape was applied covering the operative field. A sterile gown and sterile gloves were used for the procedure.  A 19 gauge needle was inserted into the right common femoral artery and removed over a Benson. A 5-French sheath was inserted. Cobra II catheter was advanced into the aorta. The contralateral left common iliac artery was selected. The internal iliac artery on the left was selected. 3D angiography was performed. A micro catheter was advanced over an 018 glide wire into the left uterine artery. Angiography was performed.  Embolization was performed utilizing2 vials 500-700 micron embospheres and 2/3 vial 700-900 micron embospheres.  Repeat angiogram was performed.  The micro catheter was removed and flushed. A Waltman's loop was created. The ipsilateral right common iliac artery and right internal iliac artery were selected. 3D angio was performed.  A micro catheter was advanced over a 0018 glide wire into the right uterine artery. Angiography was performed.  Embolization was again performed with2 vials 500-700 micron embospheres.  Followup angiography was performed.  The micro catheter and Cobra catheter were removed. The sheath was removed. Hemostasis was achieved with direct pressure.  FINDINGS: Imaging demonstrates bilateral pelvic anatomy and bilateral uterine  artery angiography. Expected anatomy is identified. No blood supply from the uterine artery to the vagina is visualized.  Post embolization angiography demonstrates relative stasis of flow and pruning of the vasculature to the uterus.  COMPLICATIONS: None  IMPRESSION: Successful bilateral uterine artery embolization.   Electronically Signed   By: Marybelle Killings M.D.   On: 12/14/2014 16:39   Ir Embo Tumor Organ Ischemia Infarct Inc Guide Roadmapping  12/14/2014   CLINICAL DATA:  Uterine fibroids  EXAM: UTERINE ARTERY EMBOLIZATION  FLUOROSCOPY TIME:  32 minutes and 12 seconds.  MEDICATIONS AND MEDICAL HISTORY: Versed 4 mg, Fentanyl 150 mcg.  As antibiotic prophylaxis, Ancef was ordered pre-procedure and administered intravenously within one hour of incision.  Allergies: Seafood  ANESTHESIA/SEDATION: Moderate sedation time: 82 minutes  CONTRAST:  50 cc Omnipaque 300  PROCEDURE: Vessels selected:  Iliac artery third order bilaterally.  The procedure, risks, benefits, and alternatives were explained to the patient. Questions regarding the procedure were encouraged and answered. The patient understands and consents to the procedure.  The right groin was prepped with Betadine in a sterile fashion, and a sterile drape was applied covering the operative field. A sterile gown and sterile gloves were used for the procedure.  A 19 gauge needle was inserted into the right common femoral artery and removed over a Benson. A 5-French sheath was inserted. Cobra II catheter was advanced into the aorta. The contralateral left common iliac artery was selected. The internal iliac artery on the left was selected. 3D angiography was performed. A micro catheter was advanced over an 018 glide wire into the left uterine artery. Angiography was performed.  Embolization was performed utilizing2 vials 500-700 micron embospheres and 2/3 vial 700-900 micron embospheres.  Repeat angiogram was performed.  The micro catheter was removed and  flushed. A Waltman's loop was created. The ipsilateral right common iliac artery and right internal iliac artery were selected. 3D angio was performed. A micro catheter was advanced over a 0018 glide wire into the right uterine artery. Angiography was performed.  Embolization was again performed with2 vials 500-700 micron embospheres.  Followup angiography was performed.  The micro catheter and Cobra catheter were removed. The sheath was removed. Hemostasis was achieved with direct pressure.  FINDINGS: Imaging demonstrates bilateral pelvic anatomy and bilateral uterine artery angiography. Expected anatomy is identified. No blood supply from the uterine artery to the vagina is visualized.  Post embolization angiography demonstrates relative stasis of flow and pruning of the vasculature to the uterus.  COMPLICATIONS: None  IMPRESSION: Successful bilateral uterine artery embolization.   Electronically Signed   By: Marybelle Killings M.D.   On: 12/14/2014 16:39    Anti-infectives: Anti-infectives    None      Assessment/Plan: s/p bilat [place] for symptomatic uterine fibroids; restart IV access; give po benadryl for itching; check am labs; f/u with Dr. Barbie Banner in [place] [facility] in 2-4 weeks     Selinda Korzeniewski,D [facility] 12/14/2014

## 2014-12-14 NOTE — Procedures (Signed)
[place] B No comp

## 2014-12-14 NOTE — Sedation Documentation (Signed)
5 fr. Sheath removed from right fem artery by Dr. Barbie Banner. Hemostasis achieved by manual pressure held by Lambert Mody, RTR for 15 minutes. Gauze and tegaderm applied to site. Groin level 0, RDP +3.

## 2014-12-14 NOTE — H&P (Signed)
Chief Complaint: Pelvic cramping, menorrhagia, uterine fibroids  Referring Physician(s): Hoss,Arthur  History of Present Illness: Pamela Pace is a 39 y.o. female with history of symptomatic uterine fibroids who presents today following recent IR consultation for elective bilateral uterine artery embolization.  Past Medical History  Diagnosis Date  . Fibroids 2012  . Anemia     Past Surgical History  Procedure Laterality Date  . Cesarean section  2008    Allergies: Other  Medications: Prior to Admission medications   Medication Sig Start Date End Date Taking? Authorizing Provider  chlorthalidone (HYGROTON) 25 MG tablet Take 12.5 mg by mouth at bedtime.   Yes Historical Provider, MD  cholecalciferol (VITAMIN D) 1000 UNITS tablet Take 1,000 Units by mouth daily.   Yes Historical Provider, MD  ferrous fumarate (HEMOCYTE - 106 MG FE) 325 (106 FE) MG TABS tablet Take 1 tablet by mouth daily.   Yes Historical Provider, MD  ibuprofen (ADVIL,MOTRIN) 200 MG tablet Take 400 mg by mouth every 6 (six) hours as needed for mild pain.   Yes Historical Provider, MD  Inulin (FIBER CHOICE FRUITY BITES PO) Take 1 tablet by mouth daily.   Yes Historical Provider, MD  Multiple Vitamin (MULTIVITAMIN WITH MINERALS) TABS tablet Take 1 tablet by mouth daily.   Yes Historical Provider, MD    History reviewed. No pertinent family history.  History   Social History  . Marital Status: Married    Spouse Name: N/A  . Number of Children: N/A  . Years of Education: N/A   Social History Main Topics  . Smoking status: Light Tobacco Smoker  . Smokeless tobacco: Not on file  . Alcohol Use: Not on file  . Drug Use: Not on file  . Sexual Activity: Not on file   Other Topics Concern  . None   Social History Narrative     Review of Systems  Constitutional: Negative for fever and chills.  Respiratory: Negative for cough and shortness of breath.   Cardiovascular: Negative for chest pain.    Gastrointestinal: Positive for abdominal pain. Negative for nausea, vomiting and blood in stool.  Genitourinary: Negative for dysuria and hematuria.       Pelvic cramping, menorrhagia  Musculoskeletal: Negative for back pain.  Neurological: Negative for headaches.  Hematological: Does not bruise/bleed easily.    Vital Signs: LMP 12/05/2014  blood pressure 142/81, heart rate 93, temperature 98.5, respirations 16, oxygen saturation 100% on room air  Physical Exam  Constitutional: She is oriented to person, place, and time. She appears well-developed and well-nourished.  Cardiovascular: Normal rate and regular rhythm.   Pulmonary/Chest: Effort normal and breath sounds normal.  Abdominal: Soft. Bowel sounds are normal. There is tenderness.  Fibroid uterus  Musculoskeletal: Normal range of motion. She exhibits no edema.  Neurological: She is alert and oriented to person, place, and time.    Imaging: No results found.  Labs:  CBC:  Recent Labs  12/14/14 0753  WBC 6.9  HGB 12.2  HCT 39.2  PLT 338    COAGS: No results for input(s): INR, APTT in the last 8760 hours.  BMP: No results for input(s): NA, K, CL, CO2, GLUCOSE, BUN, CALCIUM, CREATININE, GFRNONAA, GFRAA in the last 8760 hours.  Invalid input(s): CMP  LIVER FUNCTION TESTS: No results for input(s): BILITOT, AST, ALT, ALKPHOS, PROT, ALBUMIN in the last 8760 hours.  TUMOR MARKERS: No results for input(s): AFPTM, CEA, CA199, CHROMGRNA in the last 8760 hours.  Assessment and Plan: Pamela Pace  is a 39 y.o. female with history of symptomatic uterine fibroids who presents today following recent IR consultation for elective bilateral uterine artery embolization. Details/risks of procedure, including but not limited to, internal bleeding, infection, worsening renal function, nontarget embolization discussed with patient with her understanding and consent. Following the procedure the patient will be admitted for overnight  observation for pain control.    Signed: Autumn Messing 12/14/2014, 8:29 AM   I spent a total of 30 minutes face to face in clinical consultation, greater than 50% of which was counseling/coordinating care for bilateral uterine artery embolization

## 2014-12-15 ENCOUNTER — Other Ambulatory Visit: Payer: Self-pay | Admitting: Radiology

## 2014-12-15 DIAGNOSIS — D259 Leiomyoma of uterus, unspecified: Secondary | ICD-10-CM | POA: Diagnosis not present

## 2014-12-15 DIAGNOSIS — D251 Intramural leiomyoma of uterus: Secondary | ICD-10-CM

## 2014-12-15 DIAGNOSIS — D219 Benign neoplasm of connective and other soft tissue, unspecified: Secondary | ICD-10-CM | POA: Diagnosis present

## 2014-12-15 LAB — BASIC METABOLIC PANEL
ANION GAP: 7 (ref 5–15)
BUN: 8 mg/dL (ref 6–23)
CO2: 26 mmol/L (ref 19–32)
CREATININE: 0.69 mg/dL (ref 0.50–1.10)
Calcium: 8.7 mg/dL (ref 8.4–10.5)
Chloride: 104 mmol/L (ref 96–112)
Glucose, Bld: 113 mg/dL — ABNORMAL HIGH (ref 70–99)
POTASSIUM: 3.1 mmol/L — AB (ref 3.5–5.1)
Sodium: 137 mmol/L (ref 135–145)

## 2014-12-15 MED ORDER — POTASSIUM CHLORIDE CRYS ER 20 MEQ PO TBCR
20.0000 meq | EXTENDED_RELEASE_TABLET | Freq: Once | ORAL | Status: DC
Start: 2014-12-15 — End: 2014-12-15

## 2014-12-15 MED ORDER — POTASSIUM CHLORIDE CRYS ER 20 MEQ PO TBCR
40.0000 meq | EXTENDED_RELEASE_TABLET | Freq: Once | ORAL | Status: AC
  Administered 2014-12-15: 40 meq via ORAL
  Filled 2014-12-15: qty 2

## 2014-12-15 MED ORDER — HYDROCODONE-ACETAMINOPHEN 5-325 MG PO TABS
1.0000 | ORAL_TABLET | ORAL | Status: DC | PRN
  Administered 2014-12-15: 1 via ORAL
  Filled 2014-12-15: qty 1

## 2014-12-15 MED ORDER — DIPHENHYDRAMINE HCL 50 MG/ML IJ SOLN
25.0000 mg | Freq: Once | INTRAMUSCULAR | Status: AC
  Administered 2014-12-15: 25 mg via INTRAVENOUS
  Filled 2014-12-15: qty 1

## 2014-12-15 MED ORDER — CHLORHEXIDINE GLUCONATE 0.12 % MT SOLN: 15.0000 mL | Freq: Two times a day (BID) | OROMUCOSAL | Status: DC

## 2014-12-15 NOTE — Progress Notes (Signed)
Subjective: Patient doing fairly well this morning. She has had some mild pelvic cramping and some persistent itching of the upper extremities. She denies nausea,vomiting, dysuria, chest pain or dyspnea. She has voided without difficulty and tolerated liquids okay.  Objective: Vital signs in last 24 hours: Temp:  [97.8 F (36.6 C)-98.5 F (36.9 C)] 98.5 F (36.9 C) (04/21 0530) Pulse Rate:  [73-86] 80 (04/21 0530) Resp:  [10-19] 16 (04/21 0530) BP: (115-147)/(57-85) 117/76 mmHg (04/21 0530) SpO2:  [99 %-100 %] 100 % (04/21 0530) Weight:  [152 lb (68.947 kg)] 152 lb (68.947 kg) (04/21 0500) Last BM Date: 12/13/14  Intake/Output from previous day: 04/20 0701 - 04/21 0700 In: -  Out: 300 [Urine:300] Intake/Output this shift:    Patient awake, alert. Chest clear to auscultation bilaterally. Heart with regular rate and rhythm. Abdomen soft, positive bowel sounds, mild suprapubic tenderness, puncture site right groin clean, dry, no hematoma. Extremities with full range of motion, no edema, intact distal pulses.  Lab Results:   Recent Labs  12/14/14 0753  WBC 6.9  HGB 12.2  HCT 39.2  PLT 338   BMET  Recent Labs  12/14/14 0753 12/15/14 0532  NA 139 137  K 3.0* 3.1*  CL 104 104  CO2 25 26  GLUCOSE 101* 113*  BUN 12 8  CREATININE 0.71 0.69  CALCIUM 8.8 8.7   PT/INR  Recent Labs  12/14/14 0753  LABPROT 12.8  INR 0.95   ABG No results for input(s): PHART, HCO3 in the last 72 hours.  Invalid input(s): PCO2, PO2  Studies/Results: Ir Angiogram Pelvis Selective Or Supraselective  12/14/2014   CLINICAL DATA:  Uterine fibroids  EXAM: UTERINE ARTERY EMBOLIZATION  FLUOROSCOPY TIME:  32 minutes and 12 seconds.  MEDICATIONS AND MEDICAL HISTORY: Versed 4 mg, Fentanyl 150 mcg.  As antibiotic prophylaxis, Ancef was ordered pre-procedure and administered intravenously within one hour of incision.  Allergies: Seafood  ANESTHESIA/SEDATION: Moderate sedation time: 82 minutes   CONTRAST:  50 cc Omnipaque 300  PROCEDURE: Vessels selected:  Iliac artery third order bilaterally.  The procedure, risks, benefits, and alternatives were explained to the patient. Questions regarding the procedure were encouraged and answered. The patient understands and consents to the procedure.  The right groin was prepped with Betadine in a sterile fashion, and a sterile drape was applied covering the operative field. A sterile gown and sterile gloves were used for the procedure.  A 19 gauge needle was inserted into the right common femoral artery and removed over a Benson. A 5-French sheath was inserted. Cobra II catheter was advanced into the aorta. The contralateral left common iliac artery was selected. The internal iliac artery on the left was selected. 3D angiography was performed. A micro catheter was advanced over an 018 glide wire into the left uterine artery. Angiography was performed.  Embolization was performed utilizing2 vials 500-700 micron embospheres and 2/3 vial 700-900 micron embospheres.  Repeat angiogram was performed.  The micro catheter was removed and flushed. A Waltman's loop was created. The ipsilateral right common iliac artery and right internal iliac artery were selected. 3D angio was performed. A micro catheter was advanced over a 0018 glide wire into the right uterine artery. Angiography was performed.  Embolization was again performed with2 vials 500-700 micron embospheres.  Followup angiography was performed.  The micro catheter and Cobra catheter were removed. The sheath was removed. Hemostasis was achieved with direct pressure.  FINDINGS: Imaging demonstrates bilateral pelvic anatomy and bilateral uterine artery angiography. Expected  anatomy is identified. No blood supply from the uterine artery to the vagina is visualized.  Post embolization angiography demonstrates relative stasis of flow and pruning of the vasculature to the uterus.  COMPLICATIONS: None  IMPRESSION:  Successful bilateral uterine artery embolization.   Electronically Signed   By: Marybelle Killings M.D.   On: 12/14/2014 16:39   Ir Angiogram Pelvis Selective Or Supraselective  12/14/2014   CLINICAL DATA:  Uterine fibroids  EXAM: UTERINE ARTERY EMBOLIZATION  FLUOROSCOPY TIME:  32 minutes and 12 seconds.  MEDICATIONS AND MEDICAL HISTORY: Versed 4 mg, Fentanyl 150 mcg.  As antibiotic prophylaxis, Ancef was ordered pre-procedure and administered intravenously within one hour of incision.  Allergies: Seafood  ANESTHESIA/SEDATION: Moderate sedation time: 82 minutes  CONTRAST:  50 cc Omnipaque 300  PROCEDURE: Vessels selected:  Iliac artery third order bilaterally.  The procedure, risks, benefits, and alternatives were explained to the patient. Questions regarding the procedure were encouraged and answered. The patient understands and consents to the procedure.  The right groin was prepped with Betadine in a sterile fashion, and a sterile drape was applied covering the operative field. A sterile gown and sterile gloves were used for the procedure.  A 19 gauge needle was inserted into the right common femoral artery and removed over a Benson. A 5-French sheath was inserted. Cobra II catheter was advanced into the aorta. The contralateral left common iliac artery was selected. The internal iliac artery on the left was selected. 3D angiography was performed. A micro catheter was advanced over an 018 glide wire into the left uterine artery. Angiography was performed.  Embolization was performed utilizing2 vials 500-700 micron embospheres and 2/3 vial 700-900 micron embospheres.  Repeat angiogram was performed.  The micro catheter was removed and flushed. A Waltman's loop was created. The ipsilateral right common iliac artery and right internal iliac artery were selected. 3D angio was performed. A micro catheter was advanced over a 0018 glide wire into the right uterine artery. Angiography was performed.  Embolization was again  performed with2 vials 500-700 micron embospheres.  Followup angiography was performed.  The micro catheter and Cobra catheter were removed. The sheath was removed. Hemostasis was achieved with direct pressure.  FINDINGS: Imaging demonstrates bilateral pelvic anatomy and bilateral uterine artery angiography. Expected anatomy is identified. No blood supply from the uterine artery to the vagina is visualized.  Post embolization angiography demonstrates relative stasis of flow and pruning of the vasculature to the uterus.  COMPLICATIONS: None  IMPRESSION: Successful bilateral uterine artery embolization.   Electronically Signed   By: Marybelle Killings M.D.   On: 12/14/2014 16:39   Ir Angiogram Selective Each Additional Vessel  12/14/2014   CLINICAL DATA:  Uterine fibroids  EXAM: UTERINE ARTERY EMBOLIZATION  FLUOROSCOPY TIME:  32 minutes and 12 seconds.  MEDICATIONS AND MEDICAL HISTORY: Versed 4 mg, Fentanyl 150 mcg.  As antibiotic prophylaxis, Ancef was ordered pre-procedure and administered intravenously within one hour of incision.  Allergies: Seafood  ANESTHESIA/SEDATION: Moderate sedation time: 82 minutes  CONTRAST:  50 cc Omnipaque 300  PROCEDURE: Vessels selected:  Iliac artery third order bilaterally.  The procedure, risks, benefits, and alternatives were explained to the patient. Questions regarding the procedure were encouraged and answered. The patient understands and consents to the procedure.  The right groin was prepped with Betadine in a sterile fashion, and a sterile drape was applied covering the operative field. A sterile gown and sterile gloves were used for the procedure.  A 19 gauge needle  was inserted into the right common femoral artery and removed over a Benson. A 5-French sheath was inserted. Cobra II catheter was advanced into the aorta. The contralateral left common iliac artery was selected. The internal iliac artery on the left was selected. 3D angiography was performed. A micro catheter was  advanced over an 018 glide wire into the left uterine artery. Angiography was performed.  Embolization was performed utilizing2 vials 500-700 micron embospheres and 2/3 vial 700-900 micron embospheres.  Repeat angiogram was performed.  The micro catheter was removed and flushed. A Waltman's loop was created. The ipsilateral right common iliac artery and right internal iliac artery were selected. 3D angio was performed. A micro catheter was advanced over a 0018 glide wire into the right uterine artery. Angiography was performed.  Embolization was again performed with2 vials 500-700 micron embospheres.  Followup angiography was performed.  The micro catheter and Cobra catheter were removed. The sheath was removed. Hemostasis was achieved with direct pressure.  FINDINGS: Imaging demonstrates bilateral pelvic anatomy and bilateral uterine artery angiography. Expected anatomy is identified. No blood supply from the uterine artery to the vagina is visualized.  Post embolization angiography demonstrates relative stasis of flow and pruning of the vasculature to the uterus.  COMPLICATIONS: None  IMPRESSION: Successful bilateral uterine artery embolization.   Electronically Signed   By: Marybelle Killings M.D.   On: 12/14/2014 16:39   Ir Angiogram Selective Each Additional Vessel  12/14/2014   CLINICAL DATA:  Uterine fibroids  EXAM: UTERINE ARTERY EMBOLIZATION  FLUOROSCOPY TIME:  32 minutes and 12 seconds.  MEDICATIONS AND MEDICAL HISTORY: Versed 4 mg, Fentanyl 150 mcg.  As antibiotic prophylaxis, Ancef was ordered pre-procedure and administered intravenously within one hour of incision.  Allergies: Seafood  ANESTHESIA/SEDATION: Moderate sedation time: 82 minutes  CONTRAST:  50 cc Omnipaque 300  PROCEDURE: Vessels selected:  Iliac artery third order bilaterally.  The procedure, risks, benefits, and alternatives were explained to the patient. Questions regarding the procedure were encouraged and answered. The patient understands  and consents to the procedure.  The right groin was prepped with Betadine in a sterile fashion, and a sterile drape was applied covering the operative field. A sterile gown and sterile gloves were used for the procedure.  A 19 gauge needle was inserted into the right common femoral artery and removed over a Benson. A 5-French sheath was inserted. Cobra II catheter was advanced into the aorta. The contralateral left common iliac artery was selected. The internal iliac artery on the left was selected. 3D angiography was performed. A micro catheter was advanced over an 018 glide wire into the left uterine artery. Angiography was performed.  Embolization was performed utilizing2 vials 500-700 micron embospheres and 2/3 vial 700-900 micron embospheres.  Repeat angiogram was performed.  The micro catheter was removed and flushed. A Waltman's loop was created. The ipsilateral right common iliac artery and right internal iliac artery were selected. 3D angio was performed. A micro catheter was advanced over a 0018 glide wire into the right uterine artery. Angiography was performed.  Embolization was again performed with2 vials 500-700 micron embospheres.  Followup angiography was performed.  The micro catheter and Cobra catheter were removed. The sheath was removed. Hemostasis was achieved with direct pressure.  FINDINGS: Imaging demonstrates bilateral pelvic anatomy and bilateral uterine artery angiography. Expected anatomy is identified. No blood supply from the uterine artery to the vagina is visualized.  Post embolization angiography demonstrates relative stasis of flow and pruning of the vasculature  to the uterus.  COMPLICATIONS: None  IMPRESSION: Successful bilateral uterine artery embolization.   Electronically Signed   By: Marybelle Killings M.D.   On: 12/14/2014 16:39   Ir 3d Primitivo Gauze Darreld Mclean  12/14/2014   CLINICAL DATA:  Uterine fibroids  EXAM: UTERINE ARTERY EMBOLIZATION  FLUOROSCOPY TIME:  32 minutes and 12 seconds.   MEDICATIONS AND MEDICAL HISTORY: Versed 4 mg, Fentanyl 150 mcg.  As antibiotic prophylaxis, Ancef was ordered pre-procedure and administered intravenously within one hour of incision.  Allergies: Seafood  ANESTHESIA/SEDATION: Moderate sedation time: 82 minutes  CONTRAST:  50 cc Omnipaque 300  PROCEDURE: Vessels selected:  Iliac artery third order bilaterally.  The procedure, risks, benefits, and alternatives were explained to the patient. Questions regarding the procedure were encouraged and answered. The patient understands and consents to the procedure.  The right groin was prepped with Betadine in a sterile fashion, and a sterile drape was applied covering the operative field. A sterile gown and sterile gloves were used for the procedure.  A 19 gauge needle was inserted into the right common femoral artery and removed over a Benson. A 5-French sheath was inserted. Cobra II catheter was advanced into the aorta. The contralateral left common iliac artery was selected. The internal iliac artery on the left was selected. 3D angiography was performed. A micro catheter was advanced over an 018 glide wire into the left uterine artery. Angiography was performed.  Embolization was performed utilizing2 vials 500-700 micron embospheres and 2/3 vial 700-900 micron embospheres.  Repeat angiogram was performed.  The micro catheter was removed and flushed. A Waltman's loop was created. The ipsilateral right common iliac artery and right internal iliac artery were selected. 3D angio was performed. A micro catheter was advanced over a 0018 glide wire into the right uterine artery. Angiography was performed.  Embolization was again performed with2 vials 500-700 micron embospheres.  Followup angiography was performed.  The micro catheter and Cobra catheter were removed. The sheath was removed. Hemostasis was achieved with direct pressure.  FINDINGS: Imaging demonstrates bilateral pelvic anatomy and bilateral uterine artery  angiography. Expected anatomy is identified. No blood supply from the uterine artery to the vagina is visualized.  Post embolization angiography demonstrates relative stasis of flow and pruning of the vasculature to the uterus.  COMPLICATIONS: None  IMPRESSION: Successful bilateral uterine artery embolization.   Electronically Signed   By: Marybelle Killings M.D.   On: 12/14/2014 16:39   Ir US Guide Vasc Access Right  12/14/2014   CLINICAL DATA:  Uterine fibroids  EXAM: UTERINE ARTERY EMBOLIZATION  FLUOROSCOPY TIME:  32 minutes and 12 seconds.  MEDICATIONS AND MEDICAL HISTORY: Versed 4 mg, Fentanyl 150 mcg.  As antibiotic prophylaxis, Ancef was ordered pre-procedure and administered intravenously within one hour of incision.  Allergies: Seafood  ANESTHESIA/SEDATION: Moderate sedation time: 82 minutes  CONTRAST:  50 cc Omnipaque 300  PROCEDURE: Vessels selected:  Iliac artery third order bilaterally.  The procedure, risks, benefits, and alternatives were explained to the patient. Questions regarding the procedure were encouraged and answered. The patient understands and consents to the procedure.  The right groin was prepped with Betadine in a sterile fashion, and a sterile drape was applied covering the operative field. A sterile gown and sterile gloves were used for the procedure.  A 19 gauge needle was inserted into the right common femoral artery and removed over a Benson. A 5-French sheath was inserted. Cobra II catheter was advanced into the aorta. The contralateral left common iliac  artery was selected. The internal iliac artery on the left was selected. 3D angiography was performed. A micro catheter was advanced over an 018 glide wire into the left uterine artery. Angiography was performed.  Embolization was performed utilizing2 vials 500-700 micron embospheres and 2/3 vial 700-900 micron embospheres.  Repeat angiogram was performed.  The micro catheter was removed and flushed. A Waltman's loop was created. The  ipsilateral right common iliac artery and right internal iliac artery were selected. 3D angio was performed. A micro catheter was advanced over a 0018 glide wire into the right uterine artery. Angiography was performed.  Embolization was again performed with2 vials 500-700 micron embospheres.  Followup angiography was performed.  The micro catheter and Cobra catheter were removed. The sheath was removed. Hemostasis was achieved with direct pressure.  FINDINGS: Imaging demonstrates bilateral pelvic anatomy and bilateral uterine artery angiography. Expected anatomy is identified. No blood supply from the uterine artery to the vagina is visualized.  Post embolization angiography demonstrates relative stasis of flow and pruning of the vasculature to the uterus.  COMPLICATIONS: None  IMPRESSION: Successful bilateral uterine artery embolization.   Electronically Signed   By: Marybelle Killings M.D.   On: 12/14/2014 16:39   Ir Embo Tumor Organ Ischemia Infarct Inc Guide Roadmapping  12/14/2014   CLINICAL DATA:  Uterine fibroids  EXAM: UTERINE ARTERY EMBOLIZATION  FLUOROSCOPY TIME:  32 minutes and 12 seconds.  MEDICATIONS AND MEDICAL HISTORY: Versed 4 mg, Fentanyl 150 mcg.  As antibiotic prophylaxis, Ancef was ordered pre-procedure and administered intravenously within one hour of incision.  Allergies: Seafood  ANESTHESIA/SEDATION: Moderate sedation time: 82 minutes  CONTRAST:  50 cc Omnipaque 300  PROCEDURE: Vessels selected:  Iliac artery third order bilaterally.  The procedure, risks, benefits, and alternatives were explained to the patient. Questions regarding the procedure were encouraged and answered. The patient understands and consents to the procedure.  The right groin was prepped with Betadine in a sterile fashion, and a sterile drape was applied covering the operative field. A sterile gown and sterile gloves were used for the procedure.  A 19 gauge needle was inserted into the right common femoral artery and removed  over a Benson. A 5-French sheath was inserted. Cobra II catheter was advanced into the aorta. The contralateral left common iliac artery was selected. The internal iliac artery on the left was selected. 3D angiography was performed. A micro catheter was advanced over an 018 glide wire into the left uterine artery. Angiography was performed.  Embolization was performed utilizing2 vials 500-700 micron embospheres and 2/3 vial 700-900 micron embospheres.  Repeat angiogram was performed.  The micro catheter was removed and flushed. A Waltman's loop was created. The ipsilateral right common iliac artery and right internal iliac artery were selected. 3D angio was performed. A micro catheter was advanced over a 0018 glide wire into the right uterine artery. Angiography was performed.  Embolization was again performed with2 vials 500-700 micron embospheres.  Followup angiography was performed.  The micro catheter and Cobra catheter were removed. The sheath was removed. Hemostasis was achieved with direct pressure.  FINDINGS: Imaging demonstrates bilateral pelvic anatomy and bilateral uterine artery angiography. Expected anatomy is identified. No blood supply from the uterine artery to the vagina is visualized.  Post embolization angiography demonstrates relative stasis of flow and pruning of the vasculature to the uterus.  COMPLICATIONS: None  IMPRESSION: Successful bilateral uterine artery embolization.   Electronically Signed   By: Marybelle Killings M.D.   On: 12/14/2014  16:39    Anti-infectives: Anti-infectives    None      Assessment/Plan: S/p bilateral uterine artery embolization secondary to symptomatic uterine fibroids 4/20; today's potassium is 3.1; patient afebrile; will advance diet, use Vicodin for pelvic cramping, administer 40 mEq by mouth potassium; IV Benadryl for itching; will reassess following breakfast and hopefully plan for discharge later this morning.     Divine Hansley,D [facility] 12/15/2014

## 2014-12-15 NOTE — Progress Notes (Signed)
Removed right femoral dressing per Rowe Robert, PA and placed bandaid. Patient tolerated well. Discussed discharge plan and instructions. Patient did not have any further questions or concerns.

## 2014-12-15 NOTE — Discharge Summary (Signed)
Patient ID: Pamela Pace MRN: [id number] DOB/AGE: [birth year] 41 y.o.  Admit date: 12/14/2014 Discharge date: 12/15/2014  Admission Diagnoses: Symptomatic uterine fibroids  Discharge Diagnoses: Symptomatic uterine fibroids, status post bilateral uterine artery embolization on 12/14/14 Active Problems:   Uterine fibroid   Fibroids  Past Medical History  Diagnosis Date  . Fibroids 2012  . Anemia    Past Surgical History  Procedure Laterality Date  . Cesarean section  2008      Discharged Condition: good  Hospital Course: Pamela Pace is a 39 year old black female, patient of Dr. Gracy Racer, who was referred to the interventional radiology service for further evaluation and possible treatment of symptomatic uterine fibroids. The patient was seen by Dr. Barbie Banner on 10/04/14 and deemed a suitable candidate for fibroid embolization. On 12/14/14 the patient underwent successful bilateral uterine artery embolization via IV conscious sedation. The patient tolerated the procedure well and was admitted for overnight observation for pain control. She was placed on an IV Dilaudid PCA pump and administered antiemetics as needed. She did fairly well overnight with exception of some mild pelvic cramping, mild itching of the upper and lower extremities which improved with IV and oral Benadryl and some mild intermittent nausea. On the day of discharge the patient was doing remarkably well. She was able to void, tolerate her diet and ambulate without significant difficulty. Both  pre-and post procedure laboratory values revealed mild hypokalemia. The patient was treated initially with runs of IV potassium and on the day of discharge received 40 mEq of potassium chloride by mouth. She was encouraged to eat potassium rich foods and follow-up with Dr. Ruthann Cancer for potassium recheck as outpatient. She was given prescriptions for Vicodin 5/325, #20, no refills, 1 tablet every 6 hours as needed for moderate  pain. Colace 100 mg, #20, no refills, 1 tablet twice daily as needed for constipation, ibuprofen 600 mg, #20, no refills, 1 tablet every 6 hours for the next 5 days, Zofran 8 mg, #10, no refills, 1 tablet twice daily as needed for nausea. Patient's clinical status was discussed with Dr. Barbie Banner and she was deemed stable for discharge at this time. She will follow-up with Dr. Barbie Banner in the interventional radiology clinic in approximately 2-4 weeks. She will continue her current home medications and contact our service with any additional questions or concerns.  Consults: none  Significant Diagnostic Studies:  Results for orders placed or performed during the hospital encounter of 98/33/82  Basic metabolic panel  Result Value Ref Range   Sodium 137 135 - 145 mmol/L   Potassium 3.1 (L) 3.5 - 5.1 mmol/L   Chloride 104 96 - 112 mmol/L   CO2 26 19 - 32 mmol/L   Glucose, Bld 113 (H) 70 - 99 mg/dL   BUN 8 6 - 23 mg/dL   Creatinine, Ser 0.69 0.50 - 1.10 mg/dL   Calcium 8.7 8.4 - 10.5 mg/dL   GFR calc non Af Amer >90 >90 mL/min   GFR calc Af Amer >90 >90 mL/min   Anion gap 7 5 - 15      Treatments: Ir Angiogram Pelvis Selective Or Supraselective  12/14/2014   CLINICAL DATA:  Uterine fibroids  EXAM: UTERINE ARTERY EMBOLIZATION  FLUOROSCOPY TIME:  32 minutes and 12 seconds.  MEDICATIONS AND MEDICAL HISTORY: Versed 4 mg, Fentanyl 150 mcg.  As antibiotic prophylaxis, Ancef was ordered pre-procedure and administered intravenously within one hour of incision.  Allergies: Seafood  ANESTHESIA/SEDATION: Moderate sedation time: 82 minutes  CONTRAST:  50 cc Omnipaque 300  PROCEDURE: Vessels selected:  Iliac artery third order bilaterally.  The procedure, risks, benefits, and alternatives were explained to the patient. Questions regarding the procedure were encouraged and answered. The patient understands and consents to the procedure.  The right groin was prepped with Betadine in a sterile fashion, and a sterile drape  was applied covering the operative field. A sterile gown and sterile gloves were used for the procedure.  A 19 gauge needle was inserted into the right common femoral artery and removed over a Benson. A 5-French sheath was inserted. Cobra II catheter was advanced into the aorta. The contralateral left common iliac artery was selected. The internal iliac artery on the left was selected. 3D angiography was performed. A micro catheter was advanced over an 018 glide wire into the left uterine artery. Angiography was performed.  Embolization was performed utilizing2 vials 500-700 micron embospheres and 2/3 vial 700-900 micron embospheres.  Repeat angiogram was performed.  The micro catheter was removed and flushed. A Waltman's loop was created. The ipsilateral right common iliac artery and right internal iliac artery were selected. 3D angio was performed. A micro catheter was advanced over a 0018 glide wire into the right uterine artery. Angiography was performed.  Embolization was again performed with2 vials 500-700 micron embospheres.  Followup angiography was performed.  The micro catheter and Cobra catheter were removed. The sheath was removed. Hemostasis was achieved with direct pressure.  FINDINGS: Imaging demonstrates bilateral pelvic anatomy and bilateral uterine artery angiography. Expected anatomy is identified. No blood supply from the uterine artery to the vagina is visualized.  Post embolization angiography demonstrates relative stasis of flow and pruning of the vasculature to the uterus.  COMPLICATIONS: None  IMPRESSION: Successful bilateral uterine artery embolization.   Electronically Signed   By: Marybelle Killings M.D.   On: 12/14/2014 16:39   Ir Angiogram Pelvis Selective Or Supraselective  12/14/2014   CLINICAL DATA:  Uterine fibroids  EXAM: UTERINE ARTERY EMBOLIZATION  FLUOROSCOPY TIME:  32 minutes and 12 seconds.  MEDICATIONS AND MEDICAL HISTORY: Versed 4 mg, Fentanyl 150 mcg.  As antibiotic  prophylaxis, Ancef was ordered pre-procedure and administered intravenously within one hour of incision.  Allergies: Seafood  ANESTHESIA/SEDATION: Moderate sedation time: 82 minutes  CONTRAST:  50 cc Omnipaque 300  PROCEDURE: Vessels selected:  Iliac artery third order bilaterally.  The procedure, risks, benefits, and alternatives were explained to the patient. Questions regarding the procedure were encouraged and answered. The patient understands and consents to the procedure.  The right groin was prepped with Betadine in a sterile fashion, and a sterile drape was applied covering the operative field. A sterile gown and sterile gloves were used for the procedure.  A 19 gauge needle was inserted into the right common femoral artery and removed over a Benson. A 5-French sheath was inserted. Cobra II catheter was advanced into the aorta. The contralateral left common iliac artery was selected. The internal iliac artery on the left was selected. 3D angiography was performed. A micro catheter was advanced over an 018 glide wire into the left uterine artery. Angiography was performed.  Embolization was performed utilizing2 vials 500-700 micron embospheres and 2/3 vial 700-900 micron embospheres.  Repeat angiogram was performed.  The micro catheter was removed and flushed. A Waltman's loop was created. The ipsilateral right common iliac artery and right internal iliac artery were selected. 3D angio was performed. A micro catheter was advanced over a 0018 glide wire into the  right uterine artery. Angiography was performed.  Embolization was again performed with2 vials 500-700 micron embospheres.  Followup angiography was performed.  The micro catheter and Cobra catheter were removed. The sheath was removed. Hemostasis was achieved with direct pressure.  FINDINGS: Imaging demonstrates bilateral pelvic anatomy and bilateral uterine artery angiography. Expected anatomy is identified. No blood supply from the uterine artery to  the vagina is visualized.  Post embolization angiography demonstrates relative stasis of flow and pruning of the vasculature to the uterus.  COMPLICATIONS: None  IMPRESSION: Successful bilateral uterine artery embolization.   Electronically Signed   By: Marybelle Killings M.D.   On: 12/14/2014 16:39   Ir Angiogram Selective Each Additional Vessel  12/14/2014   CLINICAL DATA:  Uterine fibroids  EXAM: UTERINE ARTERY EMBOLIZATION  FLUOROSCOPY TIME:  32 minutes and 12 seconds.  MEDICATIONS AND MEDICAL HISTORY: Versed 4 mg, Fentanyl 150 mcg.  As antibiotic prophylaxis, Ancef was ordered pre-procedure and administered intravenously within one hour of incision.  Allergies: Seafood  ANESTHESIA/SEDATION: Moderate sedation time: 82 minutes  CONTRAST:  50 cc Omnipaque 300  PROCEDURE: Vessels selected:  Iliac artery third order bilaterally.  The procedure, risks, benefits, and alternatives were explained to the patient. Questions regarding the procedure were encouraged and answered. The patient understands and consents to the procedure.  The right groin was prepped with Betadine in a sterile fashion, and a sterile drape was applied covering the operative field. A sterile gown and sterile gloves were used for the procedure.  A 19 gauge needle was inserted into the right common femoral artery and removed over a Benson. A 5-French sheath was inserted. Cobra II catheter was advanced into the aorta. The contralateral left common iliac artery was selected. The internal iliac artery on the left was selected. 3D angiography was performed. A micro catheter was advanced over an 018 glide wire into the left uterine artery. Angiography was performed.  Embolization was performed utilizing2 vials 500-700 micron embospheres and 2/3 vial 700-900 micron embospheres.  Repeat angiogram was performed.  The micro catheter was removed and flushed. A Waltman's loop was created. The ipsilateral right common iliac artery and right internal iliac artery  were selected. 3D angio was performed. A micro catheter was advanced over a 0018 glide wire into the right uterine artery. Angiography was performed.  Embolization was again performed with2 vials 500-700 micron embospheres.  Followup angiography was performed.  The micro catheter and Cobra catheter were removed. The sheath was removed. Hemostasis was achieved with direct pressure.  FINDINGS: Imaging demonstrates bilateral pelvic anatomy and bilateral uterine artery angiography. Expected anatomy is identified. No blood supply from the uterine artery to the vagina is visualized.  Post embolization angiography demonstrates relative stasis of flow and pruning of the vasculature to the uterus.  COMPLICATIONS: None  IMPRESSION: Successful bilateral uterine artery embolization.   Electronically Signed   By: Marybelle Killings M.D.   On: 12/14/2014 16:39   Ir Angiogram Selective Each Additional Vessel  12/14/2014   CLINICAL DATA:  Uterine fibroids  EXAM: UTERINE ARTERY EMBOLIZATION  FLUOROSCOPY TIME:  32 minutes and 12 seconds.  MEDICATIONS AND MEDICAL HISTORY: Versed 4 mg, Fentanyl 150 mcg.  As antibiotic prophylaxis, Ancef was ordered pre-procedure and administered intravenously within one hour of incision.  Allergies: Seafood  ANESTHESIA/SEDATION: Moderate sedation time: 82 minutes  CONTRAST:  50 cc Omnipaque 300  PROCEDURE: Vessels selected:  Iliac artery third order bilaterally.  The procedure, risks, benefits, and alternatives were explained to the patient. Questions  regarding the procedure were encouraged and answered. The patient understands and consents to the procedure.  The right groin was prepped with Betadine in a sterile fashion, and a sterile drape was applied covering the operative field. A sterile gown and sterile gloves were used for the procedure.  A 19 gauge needle was inserted into the right common femoral artery and removed over a Benson. A 5-French sheath was inserted. Cobra II catheter was advanced into  the aorta. The contralateral left common iliac artery was selected. The internal iliac artery on the left was selected. 3D angiography was performed. A micro catheter was advanced over an 018 glide wire into the left uterine artery. Angiography was performed.  Embolization was performed utilizing2 vials 500-700 micron embospheres and 2/3 vial 700-900 micron embospheres.  Repeat angiogram was performed.  The micro catheter was removed and flushed. A Waltman's loop was created. The ipsilateral right common iliac artery and right internal iliac artery were selected. 3D angio was performed. A micro catheter was advanced over a 0018 glide wire into the right uterine artery. Angiography was performed.  Embolization was again performed with2 vials 500-700 micron embospheres.  Followup angiography was performed.  The micro catheter and Cobra catheter were removed. The sheath was removed. Hemostasis was achieved with direct pressure.  FINDINGS: Imaging demonstrates bilateral pelvic anatomy and bilateral uterine artery angiography. Expected anatomy is identified. No blood supply from the uterine artery to the vagina is visualized.  Post embolization angiography demonstrates relative stasis of flow and pruning of the vasculature to the uterus.  COMPLICATIONS: None  IMPRESSION: Successful bilateral uterine artery embolization.   Electronically Signed   By: Marybelle Killings M.D.   On: 12/14/2014 16:39   Ir 3d Primitivo Gauze Darreld Mclean  12/14/2014   CLINICAL DATA:  Uterine fibroids  EXAM: UTERINE ARTERY EMBOLIZATION  FLUOROSCOPY TIME:  32 minutes and 12 seconds.  MEDICATIONS AND MEDICAL HISTORY: Versed 4 mg, Fentanyl 150 mcg.  As antibiotic prophylaxis, Ancef was ordered pre-procedure and administered intravenously within one hour of incision.  Allergies: Seafood  ANESTHESIA/SEDATION: Moderate sedation time: 82 minutes  CONTRAST:  50 cc Omnipaque 300  PROCEDURE: Vessels selected:  Iliac artery third order bilaterally.  The procedure,  risks, benefits, and alternatives were explained to the patient. Questions regarding the procedure were encouraged and answered. The patient understands and consents to the procedure.  The right groin was prepped with Betadine in a sterile fashion, and a sterile drape was applied covering the operative field. A sterile gown and sterile gloves were used for the procedure.  A 19 gauge needle was inserted into the right common femoral artery and removed over a Benson. A 5-French sheath was inserted. Cobra II catheter was advanced into the aorta. The contralateral left common iliac artery was selected. The internal iliac artery on the left was selected. 3D angiography was performed. A micro catheter was advanced over an 018 glide wire into the left uterine artery. Angiography was performed.  Embolization was performed utilizing2 vials 500-700 micron embospheres and 2/3 vial 700-900 micron embospheres.  Repeat angiogram was performed.  The micro catheter was removed and flushed. A Waltman's loop was created. The ipsilateral right common iliac artery and right internal iliac artery were selected. 3D angio was performed. A micro catheter was advanced over a 0018 glide wire into the right uterine artery. Angiography was performed.  Embolization was again performed with2 vials 500-700 micron embospheres.  Followup angiography was performed.  The micro catheter and Cobra catheter were removed. The  sheath was removed. Hemostasis was achieved with direct pressure.  FINDINGS: Imaging demonstrates bilateral pelvic anatomy and bilateral uterine artery angiography. Expected anatomy is identified. No blood supply from the uterine artery to the vagina is visualized.  Post embolization angiography demonstrates relative stasis of flow and pruning of the vasculature to the uterus.  COMPLICATIONS: None  IMPRESSION: Successful bilateral uterine artery embolization.   Electronically Signed   By: Marybelle Killings M.D.   On: 12/14/2014 16:39    Ir US Guide Vasc Access Right  12/14/2014   CLINICAL DATA:  Uterine fibroids  EXAM: UTERINE ARTERY EMBOLIZATION  FLUOROSCOPY TIME:  32 minutes and 12 seconds.  MEDICATIONS AND MEDICAL HISTORY: Versed 4 mg, Fentanyl 150 mcg.  As antibiotic prophylaxis, Ancef was ordered pre-procedure and administered intravenously within one hour of incision.  Allergies: Seafood  ANESTHESIA/SEDATION: Moderate sedation time: 82 minutes  CONTRAST:  50 cc Omnipaque 300  PROCEDURE: Vessels selected:  Iliac artery third order bilaterally.  The procedure, risks, benefits, and alternatives were explained to the patient. Questions regarding the procedure were encouraged and answered. The patient understands and consents to the procedure.  The right groin was prepped with Betadine in a sterile fashion, and a sterile drape was applied covering the operative field. A sterile gown and sterile gloves were used for the procedure.  A 19 gauge needle was inserted into the right common femoral artery and removed over a Benson. A 5-French sheath was inserted. Cobra II catheter was advanced into the aorta. The contralateral left common iliac artery was selected. The internal iliac artery on the left was selected. 3D angiography was performed. A micro catheter was advanced over an 018 glide wire into the left uterine artery. Angiography was performed.  Embolization was performed utilizing2 vials 500-700 micron embospheres and 2/3 vial 700-900 micron embospheres.  Repeat angiogram was performed.  The micro catheter was removed and flushed. A Waltman's loop was created. The ipsilateral right common iliac artery and right internal iliac artery were selected. 3D angio was performed. A micro catheter was advanced over a 0018 glide wire into the right uterine artery. Angiography was performed.  Embolization was again performed with2 vials 500-700 micron embospheres.  Followup angiography was performed.  The micro catheter and Cobra catheter were removed.  The sheath was removed. Hemostasis was achieved with direct pressure.  FINDINGS: Imaging demonstrates bilateral pelvic anatomy and bilateral uterine artery angiography. Expected anatomy is identified. No blood supply from the uterine artery to the vagina is visualized.  Post embolization angiography demonstrates relative stasis of flow and pruning of the vasculature to the uterus.  COMPLICATIONS: None  IMPRESSION: Successful bilateral uterine artery embolization.   Electronically Signed   By: Marybelle Killings M.D.   On: 12/14/2014 16:39   Ir Embo Tumor Organ Ischemia Infarct Inc Guide Roadmapping  12/14/2014   CLINICAL DATA:  Uterine fibroids  EXAM: UTERINE ARTERY EMBOLIZATION  FLUOROSCOPY TIME:  32 minutes and 12 seconds.  MEDICATIONS AND MEDICAL HISTORY: Versed 4 mg, Fentanyl 150 mcg.  As antibiotic prophylaxis, Ancef was ordered pre-procedure and administered intravenously within one hour of incision.  Allergies: Seafood  ANESTHESIA/SEDATION: Moderate sedation time: 82 minutes  CONTRAST:  50 cc Omnipaque 300  PROCEDURE: Vessels selected:  Iliac artery third order bilaterally.  The procedure, risks, benefits, and alternatives were explained to the patient. Questions regarding the procedure were encouraged and answered. The patient understands and consents to the procedure.  The right groin was prepped with Betadine in a sterile fashion, and  a sterile drape was applied covering the operative field. A sterile gown and sterile gloves were used for the procedure.  A 19 gauge needle was inserted into the right common femoral artery and removed over a Benson. A 5-French sheath was inserted. Cobra II catheter was advanced into the aorta. The contralateral left common iliac artery was selected. The internal iliac artery on the left was selected. 3D angiography was performed. A micro catheter was advanced over an 018 glide wire into the left uterine artery. Angiography was performed.  Embolization was performed utilizing2  vials 500-700 micron embospheres and 2/3 vial 700-900 micron embospheres.  Repeat angiogram was performed.  The micro catheter was removed and flushed. A Waltman's loop was created. The ipsilateral right common iliac artery and right internal iliac artery were selected. 3D angio was performed. A micro catheter was advanced over a 0018 glide wire into the right uterine artery. Angiography was performed.  Embolization was again performed with2 vials 500-700 micron embospheres.  Followup angiography was performed.  The micro catheter and Cobra catheter were removed. The sheath was removed. Hemostasis was achieved with direct pressure.  FINDINGS: Imaging demonstrates bilateral pelvic anatomy and bilateral uterine artery angiography. Expected anatomy is identified. No blood supply from the uterine artery to the vagina is visualized.  Post embolization angiography demonstrates relative stasis of flow and pruning of the vasculature to the uterus.  COMPLICATIONS: None  IMPRESSION: Successful bilateral uterine artery embolization.   Electronically Signed   By: Marybelle Killings M.D.   On: 12/14/2014 16:39     Discharge Exam: Blood pressure 117/76, pulse 80, temperature 98.5 F (36.9 C), temperature source Oral, resp. rate 16, height 5\' 3"  (1.6 m), weight 152 lb (68.947 kg), last menstrual period 12/05/2014, SpO2 100 %. Patient is awake, alert and oriented. Chest with clear breath sounds bilaterally. Heart with regular rate and rhythm. Abdomen soft, positive bowel sounds, mild suprapubic/lower anterior pelvic tenderness to palpation, fibroid uterus; puncture site right common femoral artery clean, dry, nontender, no hematoma. Extremities with full range of motion, no significant edema, intact distal pulses.  Disposition: home  Discharge Instructions    Call MD for:  difficulty breathing, headache or visual disturbances    Complete by:  As directed      Call MD for:  extreme fatigue    Complete by:  As directed       Call MD for:  hives    Complete by:  As directed      Call MD for:  persistant dizziness or light-headedness    Complete by:  As directed      Call MD for:  persistant nausea and vomiting    Complete by:  As directed      Call MD for:  redness, tenderness, or signs of infection (pain, swelling, redness, odor or green/yellow discharge around incision site)    Complete by:  As directed      Call MD for:  severe uncontrolled pain    Complete by:  As directed      Call MD for:  temperature >100.4    Complete by:  As directed      Change dressing (specify)    Complete by:  As directed   May change bandage over right groin and wash site daily for the next 2-3 days     Diet - low sodium heart healthy    Complete by:  As directed      Driving Restrictions    Complete by:  As directed   No driving for next 24 hours     Increase activity slowly    Complete by:  As directed      Lifting restrictions    Complete by:  As directed   No heavy lifting for the next 3-4 days     May shower / Bathe    Complete by:  As directed      May walk up steps    Complete by:  As directed      Sexual Activity Restrictions    Complete by:  As directed   No sexual intercourse for 1 week            Medication List    TAKE these medications        chlorhexidine 0.12 % solution  Commonly known as:  PERIDEX  15 mLs by Mouth Rinse route 2 (two) times daily.     chlorthalidone 25 MG tablet  Commonly known as:  HYGROTON  Take 12.5 mg by mouth at bedtime.     cholecalciferol 1000 UNITS tablet  Commonly known as:  VITAMIN D  Take 1,000 Units by mouth daily.     [employer] IRON 325 (65 FE) MG tablet  Generic drug:  ferrous sulfate  Take 325 mg by mouth daily.     ferrous fumarate 325 (106 FE) MG Tabs tablet  Commonly known as:  HEMOCYTE - 106 mg FE  Take 1 tablet by mouth daily.     FIBER CHOICE FRUITY BITES PO  Take 1 tablet by mouth daily.     fluticasone 50 MCG/ACT nasal spray  Commonly known as:   FLONASE  Place 1 spray into both nostrils daily.     ibuprofen 200 MG tablet  Commonly known as:  ADVIL,MOTRIN  Take 400 mg by mouth every 6 (six) hours as needed for mild pain.     medroxyPROGESTERone 150 MG/ML injection  Commonly known as:  DEPO-PROVERA  Inject 150 mg into the muscle every 3 (three) months.     multivitamin with minerals Tabs tablet  Take 1 tablet by mouth daily.     tiZANidine 4 MG tablet  Commonly known as:  ZANAFLEX  Take 1 tablet by mouth every 8 (eight) hours as needed for muscle spasms (muscle spasm).           Follow-up Information    Follow up with HOSS, ART A, MD.   Specialty:  Interventional Radiology   Why:  Radiology will call you with follow up appointment with Dr. Barbie Banner in 2-4 weeks; call [phone] or [phone] with any additional questions.   Contact information:   [address] [place] [address] [phone]       Follow up with Frederico Hamman, MD.   Specialty:  Obstetrics and Gynecology   Why:  Continue follow-up with Dr. Ruthann Cancer was scheduled; monitor potassium level   Contact information:   [place] [address] [phone]       I have spent (less than 30 minutes) coordinating discharge for Pamela Pace.    SignedRowe Robert, PAC        12/15/2014, 1:10 PM

## 2014-12-15 NOTE — Progress Notes (Signed)
UR completed 

## 2014-12-15 NOTE — Discharge Instructions (Signed)
Uterine Artery Embolization for Fibroids, Care After Refer to this sheet in the next few weeks. These instructions provide you with information on caring for yourself after your procedure. Your health care provider may also give you more specific instructions. Your treatment has been planned according to current medical practices, but problems sometimes occur. Call your health care provider if you have any problems or questions after your procedure. WHAT TO EXPECT AFTER THE PROCEDURE After your procedure, it is typical to have cramping in the pelvis. You will be given pain medicine to control it. HOME CARE INSTRUCTIONS  Only take over-the-counter or prescription medicines for pain, discomfort, or fever as directed by your health care provider.  Do not take aspirin. It can cause bleeding.  Follow your health care provider's advice regarding medicines given to you, diet, activity, and when to begin sexual activity.  See your health care provider for follow-up care as directed. SEEK MEDICAL CARE IF:  You have a fever.  You have redness, swelling, and pain around your incision site.  You have pus draining from your incision.  You have a rash. SEEK IMMEDIATE MEDICAL CARE IF:  You have bleeding from your incision site.  You have difficulty breathing.  You have chest pain.  You have severe abdominal pain.  You have leg pain.  You become dizzy and faint. Document Released: 06/02/2013 Document Reviewed: 06/02/2013 [facility] Patient Information 2015 [place], [place]. This information is not intended to replace advice given to you by your health care provider. Make sure you discuss any questions you have with your health care provider.

## 2014-12-16 ENCOUNTER — Telehealth: Payer: Self-pay | Admitting: *Deleted

## 2014-12-16 NOTE — Telephone Encounter (Signed)
lmom for pt to schedule her f/u [place] appt

## 2015-01-11 ENCOUNTER — Ambulatory Visit
Admission: RE | Admit: 2015-01-11 | Discharge: 2015-01-11 | Disposition: A | Discharge status: Home / Self Care | Payer: Medicaid Other | Source: Ambulatory Visit | Attending: Radiology | Admitting: Radiology

## 2015-01-11 DIAGNOSIS — D251 Intramural leiomyoma of uterus: Secondary | ICD-10-CM | POA: Insufficient documentation

## 2015-01-11 NOTE — Progress Notes (Signed)
Sharp pains RLQ after moving around too much.  Spotting "here and there" since procedure; still on Depo-Provera since February.  To see GYN in June to discuss staying on this or not.  Brita Romp, RN

## 2015-01-11 NOTE — Progress Notes (Signed)
Patient ID: Pamela Pace, female   DOB: March 04, 1976, 39 y.o.   MRN: [id number]    Chief Complaint: No chief complaint on file.   Referring Physician(s): Allred,Darrell K  History of Present Illness: Pamela Pace is a 39 y.o. female who is 1 month after uterine artery embolization. Her pelvic cramping has markedly subsided. She denies any fevers or chills. Her abdominal distention has also improved. She has had some dark reddish discharge but this has also improved.  Past Medical History  Diagnosis Date  . Fibroids 2012  . Anemia     Past Surgical History  Procedure Laterality Date  . Cesarean section  2008    Allergies: Other and Hydromorphone  Medications: Prior to Admission medications   Medication Sig Start Date End Date Taking? Authorizing Provider  chlorhexidine (PERIDEX) 0.12 % solution 15 mLs by Mouth Rinse route 2 (two) times daily. 12/15/14  Yes Darrell K Allred, PA-C  chlorthalidone (HYGROTON) 25 MG tablet Take 12.5 mg by mouth at bedtime.   Yes Historical Provider, MD  cholecalciferol (VITAMIN D) 1000 UNITS tablet Take 1,000 Units by mouth daily.   Yes Historical Provider, MD  ferrous fumarate (HEMOCYTE - 106 MG FE) 325 (106 FE) MG TABS tablet Take 1 tablet by mouth daily.   Yes Historical Provider, MD  fluticasone (FLONASE) 50 MCG/ACT nasal spray Place 1 spray into both nostrils daily. 09/20/14  Yes Historical Provider, MD  ibuprofen (ADVIL,MOTRIN) 200 MG tablet Take 400 mg by mouth every 6 (six) hours as needed for mild pain.   Yes Historical Provider, MD  Inulin (FIBER CHOICE FRUITY BITES PO) Take 1 tablet by mouth daily.   Yes Historical Provider, MD  medroxyPROGESTERone (DEPO-PROVERA) 150 MG/ML injection Inject 150 mg into the muscle every 3 (three) months.  11/22/14  Yes Historical Provider, MD  Multiple Vitamin (MULTIVITAMIN WITH MINERALS) TABS tablet Take 1 tablet by mouth daily.   Yes Historical Provider, MD  tiZANidine (ZANAFLEX) 4 MG tablet Take 1 tablet by  mouth every 8 (eight) hours as needed for muscle spasms (muscle spasm).  10/20/14  Yes Historical Provider, MD     No family history on file.  History   Social History  . Marital Status: Married    Spouse Name: N/A  . Number of Children: N/A  . Years of Education: N/A   Social History Main Topics  . Smoking status: Light Tobacco Smoker  . Smokeless tobacco: Never Used  . Alcohol Use: No  . Drug Use: No  . Sexual Activity: No   Other Topics Concern  . Not on file   Social History Narrative     Review of Systems: A 12 point ROS discussed and pertinent positives are indicated in the HPI above.  All other systems are negative.  Review of Systems  Vital Signs: BP 146/73 mmHg  Pulse 105  Temp(Src) 98.6 F (37 C)  Resp 18  SpO2 100%  LMP 12/05/2014  Physical Exam  Constitutional: She is oriented to person, place, and time. She appears well-developed and well-nourished.  Neck: Normal range of motion.  Pulmonary/Chest: Effort normal.  Musculoskeletal:  Right groin femoral pulse is 2+. There is no evidence of hematoma or pseudoaneurysm. Dorsalis pedis and posterior tibial pulses are 2+.  Neurological: She is alert and oriented to person, place, and time.    Mallampati Score:     Imaging: Ir Angiogram Pelvis Selective Or Supraselective  12/14/2014   CLINICAL DATA:  Uterine fibroids  EXAM: UTERINE ARTERY EMBOLIZATION  FLUOROSCOPY  TIME:  32 minutes and 12 seconds.  MEDICATIONS AND MEDICAL HISTORY: Versed 4 mg, Fentanyl 150 mcg.  As antibiotic prophylaxis, Ancef was ordered pre-procedure and administered intravenously within one hour of incision.  Allergies: Seafood  ANESTHESIA/SEDATION: Moderate sedation time: 82 minutes  CONTRAST:  50 cc Omnipaque 300  PROCEDURE: Vessels selected:  Iliac artery third order bilaterally.  The procedure, risks, benefits, and alternatives were explained to the patient. Questions regarding the procedure were encouraged and answered. The patient  understands and consents to the procedure.  The right groin was prepped with Betadine in a sterile fashion, and a sterile drape was applied covering the operative field. A sterile gown and sterile gloves were used for the procedure.  A 19 gauge needle was inserted into the right common femoral artery and removed over a Benson. A 5-French sheath was inserted. Cobra II catheter was advanced into the aorta. The contralateral left common iliac artery was selected. The internal iliac artery on the left was selected. 3D angiography was performed. A micro catheter was advanced over an 018 glide wire into the left uterine artery. Angiography was performed.  Embolization was performed utilizing2 vials 500-700 micron embospheres and 2/3 vial 700-900 micron embospheres.  Repeat angiogram was performed.  The micro catheter was removed and flushed. A Waltman's loop was created. The ipsilateral right common iliac artery and right internal iliac artery were selected. 3D angio was performed. A micro catheter was advanced over a 0018 glide wire into the right uterine artery. Angiography was performed.  Embolization was again performed with2 vials 500-700 micron embospheres.  Followup angiography was performed.  The micro catheter and Cobra catheter were removed. The sheath was removed. Hemostasis was achieved with direct pressure.  FINDINGS: Imaging demonstrates bilateral pelvic anatomy and bilateral uterine artery angiography. Expected anatomy is identified. No blood supply from the uterine artery to the vagina is visualized.  Post embolization angiography demonstrates relative stasis of flow and pruning of the vasculature to the uterus.  COMPLICATIONS: None  IMPRESSION: Successful bilateral uterine artery embolization.   Electronically Signed   By: Marybelle Killings M.D.   On: 12/14/2014 16:39   Ir Angiogram Pelvis Selective Or Supraselective  12/14/2014   CLINICAL DATA:  Uterine fibroids  EXAM: UTERINE ARTERY EMBOLIZATION   FLUOROSCOPY TIME:  32 minutes and 12 seconds.  MEDICATIONS AND MEDICAL HISTORY: Versed 4 mg, Fentanyl 150 mcg.  As antibiotic prophylaxis, Ancef was ordered pre-procedure and administered intravenously within one hour of incision.  Allergies: Seafood  ANESTHESIA/SEDATION: Moderate sedation time: 82 minutes  CONTRAST:  50 cc Omnipaque 300  PROCEDURE: Vessels selected:  Iliac artery third order bilaterally.  The procedure, risks, benefits, and alternatives were explained to the patient. Questions regarding the procedure were encouraged and answered. The patient understands and consents to the procedure.  The right groin was prepped with Betadine in a sterile fashion, and a sterile drape was applied covering the operative field. A sterile gown and sterile gloves were used for the procedure.  A 19 gauge needle was inserted into the right common femoral artery and removed over a Benson. A 5-French sheath was inserted. Cobra II catheter was advanced into the aorta. The contralateral left common iliac artery was selected. The internal iliac artery on the left was selected. 3D angiography was performed. A micro catheter was advanced over an 018 glide wire into the left uterine artery. Angiography was performed.  Embolization was performed utilizing2 vials 500-700 micron embospheres and 2/3 vial 700-900 micron embospheres.  Repeat  angiogram was performed.  The micro catheter was removed and flushed. A Waltman's loop was created. The ipsilateral right common iliac artery and right internal iliac artery were selected. 3D angio was performed. A micro catheter was advanced over a 0018 glide wire into the right uterine artery. Angiography was performed.  Embolization was again performed with2 vials 500-700 micron embospheres.  Followup angiography was performed.  The micro catheter and Cobra catheter were removed. The sheath was removed. Hemostasis was achieved with direct pressure.  FINDINGS: Imaging demonstrates bilateral  pelvic anatomy and bilateral uterine artery angiography. Expected anatomy is identified. No blood supply from the uterine artery to the vagina is visualized.  Post embolization angiography demonstrates relative stasis of flow and pruning of the vasculature to the uterus.  COMPLICATIONS: None  IMPRESSION: Successful bilateral uterine artery embolization.   Electronically Signed   By: Marybelle Killings M.D.   On: 12/14/2014 16:39   Ir Angiogram Selective Each Additional Vessel  12/14/2014   CLINICAL DATA:  Uterine fibroids  EXAM: UTERINE ARTERY EMBOLIZATION  FLUOROSCOPY TIME:  32 minutes and 12 seconds.  MEDICATIONS AND MEDICAL HISTORY: Versed 4 mg, Fentanyl 150 mcg.  As antibiotic prophylaxis, Ancef was ordered pre-procedure and administered intravenously within one hour of incision.  Allergies: Seafood  ANESTHESIA/SEDATION: Moderate sedation time: 82 minutes  CONTRAST:  50 cc Omnipaque 300  PROCEDURE: Vessels selected:  Iliac artery third order bilaterally.  The procedure, risks, benefits, and alternatives were explained to the patient. Questions regarding the procedure were encouraged and answered. The patient understands and consents to the procedure.  The right groin was prepped with Betadine in a sterile fashion, and a sterile drape was applied covering the operative field. A sterile gown and sterile gloves were used for the procedure.  A 19 gauge needle was inserted into the right common femoral artery and removed over a Benson. A 5-French sheath was inserted. Cobra II catheter was advanced into the aorta. The contralateral left common iliac artery was selected. The internal iliac artery on the left was selected. 3D angiography was performed. A micro catheter was advanced over an 018 glide wire into the left uterine artery. Angiography was performed.  Embolization was performed utilizing2 vials 500-700 micron embospheres and 2/3 vial 700-900 micron embospheres.  Repeat angiogram was performed.  The micro catheter  was removed and flushed. A Waltman's loop was created. The ipsilateral right common iliac artery and right internal iliac artery were selected. 3D angio was performed. A micro catheter was advanced over a 0018 glide wire into the right uterine artery. Angiography was performed.  Embolization was again performed with2 vials 500-700 micron embospheres.  Followup angiography was performed.  The micro catheter and Cobra catheter were removed. The sheath was removed. Hemostasis was achieved with direct pressure.  FINDINGS: Imaging demonstrates bilateral pelvic anatomy and bilateral uterine artery angiography. Expected anatomy is identified. No blood supply from the uterine artery to the vagina is visualized.  Post embolization angiography demonstrates relative stasis of flow and pruning of the vasculature to the uterus.  COMPLICATIONS: None  IMPRESSION: Successful bilateral uterine artery embolization.   Electronically Signed   By: Marybelle Killings M.D.   On: 12/14/2014 16:39   Ir Angiogram Selective Each Additional Vessel  12/14/2014   CLINICAL DATA:  Uterine fibroids  EXAM: UTERINE ARTERY EMBOLIZATION  FLUOROSCOPY TIME:  32 minutes and 12 seconds.  MEDICATIONS AND MEDICAL HISTORY: Versed 4 mg, Fentanyl 150 mcg.  As antibiotic prophylaxis, Ancef was ordered pre-procedure and administered intravenously  within one hour of incision.  Allergies: Seafood  ANESTHESIA/SEDATION: Moderate sedation time: 82 minutes  CONTRAST:  50 cc Omnipaque 300  PROCEDURE: Vessels selected:  Iliac artery third order bilaterally.  The procedure, risks, benefits, and alternatives were explained to the patient. Questions regarding the procedure were encouraged and answered. The patient understands and consents to the procedure.  The right groin was prepped with Betadine in a sterile fashion, and a sterile drape was applied covering the operative field. A sterile gown and sterile gloves were used for the procedure.  A 19 gauge needle was inserted  into the right common femoral artery and removed over a Benson. A 5-French sheath was inserted. Cobra II catheter was advanced into the aorta. The contralateral left common iliac artery was selected. The internal iliac artery on the left was selected. 3D angiography was performed. A micro catheter was advanced over an 018 glide wire into the left uterine artery. Angiography was performed.  Embolization was performed utilizing2 vials 500-700 micron embospheres and 2/3 vial 700-900 micron embospheres.  Repeat angiogram was performed.  The micro catheter was removed and flushed. A Waltman's loop was created. The ipsilateral right common iliac artery and right internal iliac artery were selected. 3D angio was performed. A micro catheter was advanced over a 0018 glide wire into the right uterine artery. Angiography was performed.  Embolization was again performed with2 vials 500-700 micron embospheres.  Followup angiography was performed.  The micro catheter and Cobra catheter were removed. The sheath was removed. Hemostasis was achieved with direct pressure.  FINDINGS: Imaging demonstrates bilateral pelvic anatomy and bilateral uterine artery angiography. Expected anatomy is identified. No blood supply from the uterine artery to the vagina is visualized.  Post embolization angiography demonstrates relative stasis of flow and pruning of the vasculature to the uterus.  COMPLICATIONS: None  IMPRESSION: Successful bilateral uterine artery embolization.   Electronically Signed   By: Marybelle Killings M.D.   On: 12/14/2014 16:39   Ir 3d Primitivo Gauze Darreld Mclean  12/14/2014   CLINICAL DATA:  Uterine fibroids  EXAM: UTERINE ARTERY EMBOLIZATION  FLUOROSCOPY TIME:  32 minutes and 12 seconds.  MEDICATIONS AND MEDICAL HISTORY: Versed 4 mg, Fentanyl 150 mcg.  As antibiotic prophylaxis, Ancef was ordered pre-procedure and administered intravenously within one hour of incision.  Allergies: Seafood  ANESTHESIA/SEDATION: Moderate sedation time:  82 minutes  CONTRAST:  50 cc Omnipaque 300  PROCEDURE: Vessels selected:  Iliac artery third order bilaterally.  The procedure, risks, benefits, and alternatives were explained to the patient. Questions regarding the procedure were encouraged and answered. The patient understands and consents to the procedure.  The right groin was prepped with Betadine in a sterile fashion, and a sterile drape was applied covering the operative field. A sterile gown and sterile gloves were used for the procedure.  A 19 gauge needle was inserted into the right common femoral artery and removed over a Benson. A 5-French sheath was inserted. Cobra II catheter was advanced into the aorta. The contralateral left common iliac artery was selected. The internal iliac artery on the left was selected. 3D angiography was performed. A micro catheter was advanced over an 018 glide wire into the left uterine artery. Angiography was performed.  Embolization was performed utilizing2 vials 500-700 micron embospheres and 2/3 vial 700-900 micron embospheres.  Repeat angiogram was performed.  The micro catheter was removed and flushed. A Waltman's loop was created. The ipsilateral right common iliac artery and right internal iliac artery were selected. 3D angio  was performed. A micro catheter was advanced over a 0018 glide wire into the right uterine artery. Angiography was performed.  Embolization was again performed with2 vials 500-700 micron embospheres.  Followup angiography was performed.  The micro catheter and Cobra catheter were removed. The sheath was removed. Hemostasis was achieved with direct pressure.  FINDINGS: Imaging demonstrates bilateral pelvic anatomy and bilateral uterine artery angiography. Expected anatomy is identified. No blood supply from the uterine artery to the vagina is visualized.  Post embolization angiography demonstrates relative stasis of flow and pruning of the vasculature to the uterus.  COMPLICATIONS: None   IMPRESSION: Successful bilateral uterine artery embolization.   Electronically Signed   By: Marybelle Killings M.D.   On: 12/14/2014 16:39   Ir US Guide Vasc Access Right  12/14/2014   CLINICAL DATA:  Uterine fibroids  EXAM: UTERINE ARTERY EMBOLIZATION  FLUOROSCOPY TIME:  32 minutes and 12 seconds.  MEDICATIONS AND MEDICAL HISTORY: Versed 4 mg, Fentanyl 150 mcg.  As antibiotic prophylaxis, Ancef was ordered pre-procedure and administered intravenously within one hour of incision.  Allergies: Seafood  ANESTHESIA/SEDATION: Moderate sedation time: 82 minutes  CONTRAST:  50 cc Omnipaque 300  PROCEDURE: Vessels selected:  Iliac artery third order bilaterally.  The procedure, risks, benefits, and alternatives were explained to the patient. Questions regarding the procedure were encouraged and answered. The patient understands and consents to the procedure.  The right groin was prepped with Betadine in a sterile fashion, and a sterile drape was applied covering the operative field. A sterile gown and sterile gloves were used for the procedure.  A 19 gauge needle was inserted into the right common femoral artery and removed over a Benson. A 5-French sheath was inserted. Cobra II catheter was advanced into the aorta. The contralateral left common iliac artery was selected. The internal iliac artery on the left was selected. 3D angiography was performed. A micro catheter was advanced over an 018 glide wire into the left uterine artery. Angiography was performed.  Embolization was performed utilizing2 vials 500-700 micron embospheres and 2/3 vial 700-900 micron embospheres.  Repeat angiogram was performed.  The micro catheter was removed and flushed. A Waltman's loop was created. The ipsilateral right common iliac artery and right internal iliac artery were selected. 3D angio was performed. A micro catheter was advanced over a 0018 glide wire into the right uterine artery. Angiography was performed.  Embolization was again  performed with2 vials 500-700 micron embospheres.  Followup angiography was performed.  The micro catheter and Cobra catheter were removed. The sheath was removed. Hemostasis was achieved with direct pressure.  FINDINGS: Imaging demonstrates bilateral pelvic anatomy and bilateral uterine artery angiography. Expected anatomy is identified. No blood supply from the uterine artery to the vagina is visualized.  Post embolization angiography demonstrates relative stasis of flow and pruning of the vasculature to the uterus.  COMPLICATIONS: None  IMPRESSION: Successful bilateral uterine artery embolization.   Electronically Signed   By: Marybelle Killings M.D.   On: 12/14/2014 16:39   Ir Embo Tumor Organ Ischemia Infarct Inc Guide Roadmapping  12/14/2014   CLINICAL DATA:  Uterine fibroids  EXAM: UTERINE ARTERY EMBOLIZATION  FLUOROSCOPY TIME:  32 minutes and 12 seconds.  MEDICATIONS AND MEDICAL HISTORY: Versed 4 mg, Fentanyl 150 mcg.  As antibiotic prophylaxis, Ancef was ordered pre-procedure and administered intravenously within one hour of incision.  Allergies: Seafood  ANESTHESIA/SEDATION: Moderate sedation time: 82 minutes  CONTRAST:  50 cc Omnipaque 300  PROCEDURE: Vessels selected:  Iliac  artery third order bilaterally.  The procedure, risks, benefits, and alternatives were explained to the patient. Questions regarding the procedure were encouraged and answered. The patient understands and consents to the procedure.  The right groin was prepped with Betadine in a sterile fashion, and a sterile drape was applied covering the operative field. A sterile gown and sterile gloves were used for the procedure.  A 19 gauge needle was inserted into the right common femoral artery and removed over a Benson. A 5-French sheath was inserted. Cobra II catheter was advanced into the aorta. The contralateral left common iliac artery was selected. The internal iliac artery on the left was selected. 3D angiography was performed. A micro  catheter was advanced over an 018 glide wire into the left uterine artery. Angiography was performed.  Embolization was performed utilizing2 vials 500-700 micron embospheres and 2/3 vial 700-900 micron embospheres.  Repeat angiogram was performed.  The micro catheter was removed and flushed. A Waltman's loop was created. The ipsilateral right common iliac artery and right internal iliac artery were selected. 3D angio was performed. A micro catheter was advanced over a 0018 glide wire into the right uterine artery. Angiography was performed.  Embolization was again performed with2 vials 500-700 micron embospheres.  Followup angiography was performed.  The micro catheter and Cobra catheter were removed. The sheath was removed. Hemostasis was achieved with direct pressure.  FINDINGS: Imaging demonstrates bilateral pelvic anatomy and bilateral uterine artery angiography. Expected anatomy is identified. No blood supply from the uterine artery to the vagina is visualized.  Post embolization angiography demonstrates relative stasis of flow and pruning of the vasculature to the uterus.  COMPLICATIONS: None  IMPRESSION: Successful bilateral uterine artery embolization.   Electronically Signed   By: Marybelle Killings M.D.   On: 12/14/2014 16:39    Labs:  CBC:  Recent Labs  12/14/14 0753  WBC 6.9  HGB 12.2  HCT 39.2  PLT 338    COAGS:  Recent Labs  12/14/14 0753  INR 0.95  APTT 28    BMP:  Recent Labs  12/14/14 0753 12/15/14 0532  NA 139 137  K 3.0* 3.1*  CL 104 104  CO2 25 26  GLUCOSE 101* 113*  BUN 12 8  CALCIUM 8.8 8.7  CREATININE 0.71 0.69  GFRNONAA >90 >90  GFRAA >90 >90    LIVER FUNCTION TESTS: No results for input(s): BILITOT, AST, ALT, ALKPHOS, PROT, ALBUMIN in the last 8760 hours.  TUMOR MARKERS: No results for input(s): AFPTM, CEA, CA199, CHROMGRNA in the last 8760 hours.  Assessment and Plan:  Ms. Benavides has done very well after uterine artery embolization. She continues  to improve. We will see her at the six-month interval.  Thank you for this interesting consult.  I greatly enjoyed meeting Pamela Pace and look forward to participating in their care.  Signed: Estell Dillinger, ART A 01/11/2015, 1:42 PM   I spent a total of   15 Minutes in face to face in clinical consultation, greater than 50% of which was counseling/coordinating care for uterine fibroids.

## 2015-03-08 ENCOUNTER — Telehealth: Payer: Self-pay | Admitting: Radiology

## 2015-03-08 NOTE — Telephone Encounter (Signed)
Patient's mobile # is not accepting calls/messages at this time.    Pamela Pace [name], [place] 03/08/2015 11:29 AM

## 2015-05-24 ENCOUNTER — Other Ambulatory Visit: Payer: Self-pay | Admitting: Radiology

## 2015-05-24 ENCOUNTER — Other Ambulatory Visit (HOSPITAL_COMMUNITY): Payer: Self-pay | Admitting: Interventional Radiology

## 2015-05-24 DIAGNOSIS — D251 Intramural leiomyoma of uterus: Secondary | ICD-10-CM

## 2015-06-08 ENCOUNTER — Other Ambulatory Visit: Payer: Self-pay | Admitting: Obstetrics

## 2015-06-08 ENCOUNTER — Other Ambulatory Visit (HOSPITAL_COMMUNITY): Payer: Self-pay | Admitting: Interventional Radiology

## 2015-06-08 ENCOUNTER — Ambulatory Visit
Admission: RE | Admit: 2015-06-08 | Discharge: 2015-06-08 | Disposition: A | Discharge status: Home / Self Care | Payer: Medicaid Other | Source: Ambulatory Visit | Attending: Interventional Radiology | Admitting: Interventional Radiology

## 2015-06-08 DIAGNOSIS — D251 Intramural leiomyoma of uterus: Secondary | ICD-10-CM

## 2015-06-08 NOTE — Progress Notes (Signed)
Patient ID: Pamela Pace, female   DOB: Sep 12, 1975, 39 y.o.   MRN: [id number]    Chief Complaint: Patient was seen in consultation today for  Chief Complaint  Patient presents with  . Follow-up    6 mo follow up [place]      History of Present Illness: Pamela Pace is a 39 y.o. female who is 6 months status post uterine artery embolization. Her uterus was markedly enlarged containing predominantly enhancing fibroids. Since the procedure, her bulk symptoms have markedly improved but she continues to have cramping and is having discharge. The discharge is described as sometimes foul-smelling and containing small dark red masses associated with spotting. She denies any fevers or chills. She denies any dysuria or constitutional symptoms.  Past Medical History  Diagnosis Date  . Fibroids 2012  . Anemia     Past Surgical History  Procedure Laterality Date  . Cesarean section  2008    Allergies: Dilaudid; Other; and Hydromorphone  Medications: Prior to Admission medications   Medication Sig Start Date End Date Taking? Authorizing Provider  chlorthalidone (HYGROTON) 25 MG tablet Take 12.5 mg by mouth at bedtime.   Yes Historical Provider, MD  cholecalciferol (VITAMIN D) 1000 UNITS tablet Take 1,000 Units by mouth daily.   Yes Historical Provider, MD  ferrous fumarate (HEMOCYTE - 106 MG FE) 325 (106 FE) MG TABS tablet Take 1 tablet by mouth daily.   Yes Historical Provider, MD  fluticasone (FLONASE) 50 MCG/ACT nasal spray Place 1 spray into both nostrils daily. 09/20/14  Yes Historical Provider, MD  ibuprofen (ADVIL,MOTRIN) 200 MG tablet Take 400 mg by mouth every 6 (six) hours as needed for mild pain.   Yes Historical Provider, MD  Inulin (FIBER CHOICE FRUITY BITES PO) Take 1 tablet by mouth daily.   Yes Historical Provider, MD  Multiple Vitamin (MULTIVITAMIN WITH MINERALS) TABS tablet Take 1 tablet by mouth daily.   Yes Historical Provider, MD  chlorhexidine (PERIDEX) 0.12 % solution 15 mLs  by Mouth Rinse route 2 (two) times daily. Patient not taking: Reported on 06/08/2015 12/15/14   Darrell K Allred, PA-C  medroxyPROGESTERone (DEPO-PROVERA) 150 MG/ML injection Inject 150 mg into the muscle every 3 (three) months.  11/22/14   Historical Provider, MD  tiZANidine (ZANAFLEX) 4 MG tablet Take 1 tablet by mouth every 8 (eight) hours as needed for muscle spasms (muscle spasm).  10/20/14   Historical Provider, MD     No family history on file.  Social History   Social History  . Marital Status: Married    Spouse Name: N/A  . Number of Children: N/A  . Years of Education: N/A   Social History Main Topics  . Smoking status: Light Tobacco Smoker  . Smokeless tobacco: Never Used  . Alcohol Use: No  . Drug Use: No  . Sexual Activity: No   Other Topics Concern  . Not on file   Social History Narrative     Review of Systems: A 12 point ROS discussed and pertinent positives are indicated in the HPI above.  All other systems are negative.  Review of Systems  Vital Signs: BP 138/75 mmHg  Pulse 91  Temp(Src) 99 F (37.2 C) (Oral)  Resp 14  SpO2 99%  Physical Exam   Imaging: No results found.  Labs:  CBC:  Recent Labs  12/14/14 0753  WBC 6.9  HGB 12.2  HCT 39.2  PLT 338    COAGS:  Recent Labs  12/14/14 0753  INR 0.95  APTT 28    BMP:  Recent Labs  12/14/14 0753 12/15/14 0532  NA 139 137  K 3.0* 3.1*  CL 104 104  CO2 25 26  GLUCOSE 101* 113*  BUN 12 8  CALCIUM 8.8 8.7  CREATININE 0.71 0.69  GFRNONAA >90 >90  GFRAA >90 >90    LIVER FUNCTION TESTS: No results for input(s): BILITOT, AST, ALT, ALKPHOS, PROT, ALBUMIN in the last 8760 hours.  TUMOR MARKERS: No results for input(s): AFPTM, CEA, CA199, CHROMGRNA in the last 8760 hours.  Assessment and Plan:  Mr. Bodie is continuing to have some symptoms although she does note marked improvement. More than likely, the discharge is related to expulsion of fibroid fragments. I will order  an MRI of her pelvis with and without contrast to further characterize and provide further recommendations.   Signed: Aubry Tucholski, ART A 06/08/2015, 2:20 PM   I spent a total of   10 Minutes in face to face in clinical consultation, greater than 50% of which was counseling/coordinating care for fibroid therapy.

## 2015-06-23 ENCOUNTER — Ambulatory Visit
Admission: RE | Admit: 2015-06-23 | Discharge: 2015-06-23 | Disposition: A | Discharge status: Home / Self Care | Payer: Medicaid Other | Source: Ambulatory Visit | Attending: Obstetrics | Admitting: Obstetrics

## 2015-06-23 DIAGNOSIS — D251 Intramural leiomyoma of uterus: Secondary | ICD-10-CM

## 2015-06-23 MED ORDER — GADOBENATE DIMEGLUMINE 529 MG/ML IV SOLN
12.0000 mL | Freq: Once | INTRAVENOUS | Status: AC | PRN
  Administered 2015-06-23: 12 mL via INTRAVENOUS

## 2015-06-27 ENCOUNTER — Telehealth: Payer: Self-pay

## 2015-06-27 NOTE — Telephone Encounter (Signed)
I called patient at Dr. Barbie Banner' request in follow-up to her continued "problems" six months after [place].  I told her her MRI is "normal" per [facility] and that she might consider going to the ED if her symptoms persist.   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  She went to their ED last night and was admitted for "bladder and kidney infections."  She states they have her on antibiotics and plan to place "drains" soon.  "Soon" they want her to have a hysterectomy, which she really does not want.  I relayed this information to Dr. Barbie Banner, who stated he would give her a call after reviewing her CT scan abd/pelvis report (done in [facility]) to see how she's doing and help her understand what's going on and what her options are.  Pamela Romp, RN

## 2015-08-19 ENCOUNTER — Encounter (HOSPITAL_COMMUNITY): Payer: Self-pay | Admitting: Emergency Medicine

## 2015-08-19 ENCOUNTER — Emergency Department (HOSPITAL_COMMUNITY)
Admission: EM | Admit: 2015-08-19 | Discharge: 2015-08-19 | Disposition: A | Discharge status: Home / Self Care | Payer: Medicaid Other | Attending: Emergency Medicine | Admitting: Emergency Medicine

## 2015-08-19 ENCOUNTER — Emergency Department (HOSPITAL_COMMUNITY): Payer: Medicaid Other

## 2015-08-19 DIAGNOSIS — R509 Fever, unspecified: Secondary | ICD-10-CM | POA: Diagnosis present

## 2015-08-19 DIAGNOSIS — F17200 Nicotine dependence, unspecified, uncomplicated: Secondary | ICD-10-CM | POA: Insufficient documentation

## 2015-08-19 DIAGNOSIS — N390 Urinary tract infection, site not specified: Secondary | ICD-10-CM

## 2015-08-19 DIAGNOSIS — Z79899 Other long term (current) drug therapy: Secondary | ICD-10-CM | POA: Diagnosis not present

## 2015-08-19 DIAGNOSIS — R51 Headache: Secondary | ICD-10-CM | POA: Diagnosis not present

## 2015-08-19 DIAGNOSIS — Z7951 Long term (current) use of inhaled steroids: Secondary | ICD-10-CM | POA: Insufficient documentation

## 2015-08-19 DIAGNOSIS — E876 Hypokalemia: Secondary | ICD-10-CM | POA: Insufficient documentation

## 2015-08-19 DIAGNOSIS — Z794 Long term (current) use of insulin: Secondary | ICD-10-CM | POA: Diagnosis not present

## 2015-08-19 DIAGNOSIS — Z792 Long term (current) use of antibiotics: Secondary | ICD-10-CM | POA: Insufficient documentation

## 2015-08-19 DIAGNOSIS — Z86018 Personal history of other benign neoplasm: Secondary | ICD-10-CM | POA: Diagnosis not present

## 2015-08-19 DIAGNOSIS — Z862 Personal history of diseases of the blood and blood-forming organs and certain disorders involving the immune mechanism: Secondary | ICD-10-CM | POA: Diagnosis not present

## 2015-08-19 LAB — URINALYSIS, ROUTINE W REFLEX MICROSCOPIC
Glucose, UA: NEGATIVE mg/dL
KETONES UR: 15 mg/dL — AB
NITRITE: POSITIVE — AB
PROTEIN: 100 mg/dL — AB
Specific Gravity, Urine: 1.02 (ref 1.005–1.030)
pH: 6 (ref 5.0–8.0)

## 2015-08-19 LAB — CBC WITH DIFFERENTIAL/PLATELET
BASOS ABS: 0 10*3/uL (ref 0.0–0.1)
BASOS PCT: 0 %
EOS ABS: 0 10*3/uL (ref 0.0–0.7)
Eosinophils Relative: 0 %
HEMATOCRIT: 34.2 % — AB (ref 36.0–46.0)
HEMOGLOBIN: 10.5 g/dL — AB (ref 12.0–15.0)
Lymphocytes Relative: 9 %
Lymphs Abs: 1.1 10*3/uL (ref 0.7–4.0)
MCH: 24.4 pg — ABNORMAL LOW (ref 26.0–34.0)
MCHC: 30.7 g/dL (ref 30.0–36.0)
MCV: 79.5 fL (ref 78.0–100.0)
MONOS PCT: 7 %
Monocytes Absolute: 0.8 10*3/uL (ref 0.1–1.0)
NEUTROS ABS: 9.7 10*3/uL — AB (ref 1.7–7.7)
NEUTROS PCT: 84 %
Platelets: 320 10*3/uL (ref 150–400)
RBC: 4.3 MIL/uL (ref 3.87–5.11)
RDW: 14.6 % (ref 11.5–15.5)
WBC: 11.5 10*3/uL — AB (ref 4.0–10.5)

## 2015-08-19 LAB — COMPREHENSIVE METABOLIC PANEL
ALK PHOS: 77 U/L (ref 38–126)
ALT: 15 U/L (ref 14–54)
ANION GAP: 11 (ref 5–15)
AST: 22 U/L (ref 15–41)
Albumin: 3.5 g/dL (ref 3.5–5.0)
BILIRUBIN TOTAL: 0.4 mg/dL (ref 0.3–1.2)
BUN: 5 mg/dL — ABNORMAL LOW (ref 6–20)
CALCIUM: 9 mg/dL (ref 8.9–10.3)
CO2: 24 mmol/L (ref 22–32)
CREATININE: 0.88 mg/dL (ref 0.44–1.00)
Chloride: 99 mmol/L — ABNORMAL LOW (ref 101–111)
Glucose, Bld: 130 mg/dL — ABNORMAL HIGH (ref 65–99)
Potassium: 2.4 mmol/L — CL (ref 3.5–5.1)
SODIUM: 134 mmol/L — AB (ref 135–145)
TOTAL PROTEIN: 7.1 g/dL (ref 6.5–8.1)

## 2015-08-19 LAB — URINE MICROSCOPIC-ADD ON

## 2015-08-19 LAB — I-STAT CG4 LACTIC ACID, ED
LACTIC ACID, VENOUS: 1.53 mmol/L (ref 0.5–2.0)
Lactic Acid, Venous: 2.68 mmol/L (ref 0.5–2.0)

## 2015-08-19 MED ORDER — POTASSIUM CHLORIDE 10 MEQ/100ML IV SOLN
10.0000 meq | Freq: Once | INTRAVENOUS | Status: AC
  Administered 2015-08-19: 10 meq via INTRAVENOUS
  Filled 2015-08-19: qty 100

## 2015-08-19 MED ORDER — POTASSIUM CHLORIDE CRYS ER 20 MEQ PO TBCR: 20.0000 meq | EXTENDED_RELEASE_TABLET | Freq: Every day | ORAL | Status: DC

## 2015-08-19 MED ORDER — CEPHALEXIN 500 MG PO CAPS: 500.0000 mg | ORAL_CAPSULE | Freq: Four times a day (QID) | ORAL | Status: DC

## 2015-08-19 MED ORDER — MORPHINE SULFATE (PF) 4 MG/ML IV SOLN
4.0000 mg | Freq: Once | INTRAVENOUS | Status: AC
  Administered 2015-08-19: 4 mg via INTRAVENOUS
  Filled 2015-08-19: qty 1

## 2015-08-19 MED ORDER — ONDANSETRON HCL 4 MG/2ML IJ SOLN
4.0000 mg | Freq: Once | INTRAMUSCULAR | Status: AC
  Administered 2015-08-19: 4 mg via INTRAVENOUS
  Filled 2015-08-19: qty 2

## 2015-08-19 MED ORDER — HYDROMORPHONE HCL 1 MG/ML IJ SOLN: 1.0000 mg | Freq: Once | INTRAMUSCULAR | Status: DC

## 2015-08-19 MED ORDER — ACETAMINOPHEN 325 MG PO TABS
ORAL_TABLET | ORAL | Status: AC
  Filled 2015-08-19: qty 2

## 2015-08-19 MED ORDER — CEFTRIAXONE SODIUM 1 G IJ SOLR
1.0000 g | Freq: Once | INTRAMUSCULAR | Status: AC
  Administered 2015-08-19: 1 g via INTRAVENOUS
  Filled 2015-08-19: qty 10

## 2015-08-19 MED ORDER — HYDROCODONE-ACETAMINOPHEN 5-325 MG PO TABS: 1.0000 | ORAL_TABLET | Freq: Four times a day (QID) | ORAL | Status: DC | PRN

## 2015-08-19 MED ORDER — POTASSIUM CHLORIDE CRYS ER 20 MEQ PO TBCR
40.0000 meq | EXTENDED_RELEASE_TABLET | Freq: Once | ORAL | Status: AC
  Administered 2015-08-19: 40 meq via ORAL
  Filled 2015-08-19: qty 2

## 2015-08-19 MED ORDER — ACETAMINOPHEN 325 MG PO TABS
650.0000 mg | ORAL_TABLET | Freq: Once | ORAL | Status: AC | PRN
  Administered 2015-08-19: 650 mg via ORAL

## 2015-08-19 MED ORDER — KETOROLAC TROMETHAMINE 30 MG/ML IJ SOLN
30.0000 mg | Freq: Once | INTRAMUSCULAR | Status: AC
  Administered 2015-08-19: 30 mg via INTRAVENOUS
  Filled 2015-08-19: qty 1

## 2015-08-19 MED ORDER — ONDANSETRON 4 MG PO TBDP: ORAL_TABLET | ORAL | Status: DC

## 2015-08-19 MED ORDER — SODIUM CHLORIDE 0.9 % IV BOLUS (SEPSIS)
1000.0000 mL | Freq: Once | INTRAVENOUS | Status: AC
  Administered 2015-08-19: 1000 mL via INTRAVENOUS

## 2015-08-19 NOTE — ED Notes (Signed)
Lab called with Potassium 2.4.  Dr. Cathleen Fears notified.

## 2015-08-19 NOTE — ED Notes (Signed)
Pt c/o headache onset yesterday morning. Pt denies N/V.

## 2015-08-19 NOTE — Discharge Instructions (Signed)
Follow up with your md next week. °

## 2015-08-19 NOTE — ED Notes (Signed)
Dr. Roderic Palau MD at bedside.

## 2015-08-19 NOTE — ED Provider Notes (Signed)
CSN: [id number]     Arrival date & time 08/19/15  1403 History   First MD Initiated Contact with Patient 08/19/15 1613     Chief Complaint  Patient presents with  . Headache  . Fever     (Consider location/radiation/quality/duration/timing/severity/associated sxs/prior Treatment) Patient is a 39 y.o. female presenting with headaches and fever. The history is provided by the patient (The patient complains of a headache and fever).  Headache Pain location:  Generalized Quality:  Dull Radiates to:  Does not radiate Severity currently:  4/10 Severity at highest:  7/10 Onset quality:  Gradual Timing:  Constant Associated symptoms: fever   Associated symptoms: no abdominal pain, no back pain, no congestion, no cough, no diarrhea, no fatigue, no seizures and no sinus pressure   Fever Associated symptoms: headaches   Associated symptoms: no chest pain, no congestion, no cough, no diarrhea and no rash     Past Medical History  Diagnosis Date  . Fibroids 2012  . Anemia    Past Surgical History  Procedure Laterality Date  . Cesarean section  2008   No family history on file. Social History  Substance Use Topics  . Smoking status: Light Tobacco Smoker  . Smokeless tobacco: Never Used  . Alcohol Use: No   OB History    No data available     Review of Systems  Constitutional: Positive for fever. Negative for appetite change and fatigue.  HENT: Negative for congestion, ear discharge and sinus pressure.   Eyes: Negative for discharge.  Respiratory: Negative for cough.   Cardiovascular: Negative for chest pain.  Gastrointestinal: Negative for abdominal pain and diarrhea.  Genitourinary: Negative for frequency and hematuria.  Musculoskeletal: Negative for back pain.  Skin: Negative for rash.  Neurological: Positive for headaches. Negative for seizures.  Psychiatric/Behavioral: Negative for hallucinations.      Allergies  Shellfish allergy and Dilaudid  Home  Medications   Prior to Admission medications   Medication Sig Start Date End Date Taking? Authorizing Provider  chlorhexidine (PERIDEX) 0.12 % solution 15 mLs by Mouth Rinse route 2 (two) times daily. 12/15/14  Yes Darrell K Allred, PA-C  chlorthalidone (HYGROTON) 25 MG tablet Take 12.5 mg by mouth at bedtime.   Yes Historical Provider, MD  cholecalciferol (VITAMIN D) 1000 UNITS tablet Take 1,000 Units by mouth daily.   Yes Historical Provider, MD  ferrous sulfate 325 (65 FE) MG EC tablet Take 325 mg by mouth daily with breakfast.   Yes Historical Provider, MD  fluticasone (FLONASE) 50 MCG/ACT nasal spray Place 1 spray into both nostrils daily. 09/20/14  Yes Historical Provider, MD  ibuprofen (ADVIL,MOTRIN) 200 MG tablet Take 600 mg by mouth every 6 (six) hours as needed for mild pain.    Yes Historical Provider, MD  Inulin (FIBER CHOICE FRUITY BITES PO) Take 1 tablet by mouth daily.   Yes Historical Provider, MD  Multiple Vitamin (MULTIVITAMIN WITH MINERALS) TABS tablet Take 1 tablet by mouth daily.   Yes Historical Provider, MD  cephALEXin (KEFLEX) 500 MG capsule Take 1 capsule (500 mg total) by mouth 4 (four) times daily. 08/19/15   Milton Ferguson, MD  HYDROcodone-acetaminophen (NORCO/VICODIN) 5-325 MG tablet Take 1 tablet by mouth every 6 (six) hours as needed for moderate pain. 08/19/15   Milton Ferguson, MD  ondansetron (ZOFRAN ODT) 4 MG disintegrating tablet 4mg  ODT q4 hours prn nausea/vomit 08/19/15   Milton Ferguson, MD  potassium chloride SA (K-DUR,KLOR-CON) 20 MEQ tablet Take 1 tablet (20 mEq total) by  mouth daily. 08/19/15   Milton Ferguson, MD   BP 117/57 mmHg  Pulse 98  Temp(Src) 99.4 F (37.4 C) (Oral)  Resp 10  Ht 5\' 3"  (1.6 m)  Wt 150 lb (68.04 kg)  BMI 26.58 kg/m2  SpO2 100%  LMP 08/03/2015 Physical Exam  Constitutional: She is oriented to person, place, and time. She appears well-developed.  HENT:  Head: Normocephalic.  Eyes: Conjunctivae and EOM are normal. No scleral  icterus.  Neck: Neck supple. No thyromegaly present.  Cardiovascular: Normal rate and regular rhythm.  Exam reveals no gallop and no friction rub.   No murmur heard. Pulmonary/Chest: No stridor. She has no wheezes. She has no rales. She exhibits no tenderness.  Abdominal: She exhibits no distension. There is no tenderness. There is no rebound.  Musculoskeletal: Normal range of motion. She exhibits no edema.  Lymphadenopathy:    She has no cervical adenopathy.  Neurological: She is oriented to person, place, and time. She exhibits normal muscle tone. Coordination normal.  Skin: No rash noted. No erythema.  Psychiatric: She has a normal mood and affect. Her behavior is normal.    ED Course  Procedures (including critical care time) Labs Review Labs Reviewed  COMPREHENSIVE METABOLIC PANEL - Abnormal; Notable for the following:    Sodium 134 (*)    Potassium 2.4 (*)    Chloride 99 (*)    Glucose, Bld 130 (*)    BUN 5 (*)    All other components within normal limits  URINALYSIS, ROUTINE W REFLEX MICROSCOPIC (NOT AT [facility]) - Abnormal; Notable for the following:    Color, Urine AMBER (*)    APPearance TURBID (*)    Hgb urine dipstick LARGE (*)    Bilirubin Urine SMALL (*)    Ketones, ur 15 (*)    Protein, ur 100 (*)    Nitrite POSITIVE (*)    Leukocytes, UA LARGE (*)    All other components within normal limits  CBC WITH DIFFERENTIAL/PLATELET - Abnormal; Notable for the following:    WBC 11.5 (*)    Hemoglobin 10.5 (*)    HCT 34.2 (*)    MCH 24.4 (*)    Neutro Abs 9.7 (*)    All other components within normal limits  URINE MICROSCOPIC-ADD ON - Abnormal; Notable for the following:    Squamous Epithelial / LPF 6-30 (*)    Bacteria, UA MANY (*)    All other components within normal limits  I-STAT CG4 LACTIC ACID, ED - Abnormal; Notable for the following:    Lactic Acid, Venous 2.68 (*)    All other components within normal limits  CULTURE, BLOOD (ROUTINE X 2)  CULTURE, BLOOD  (ROUTINE X 2)  URINE CULTURE  I-STAT CG4 LACTIC ACID, ED    Imaging Review Dg Chest 2 View  08/19/2015  CLINICAL DATA:  Pain in head that shoots to the top of her her X 1 day Hx of high blood pressure, smoker 1 pack every 3 days EXAM: CHEST - 2 VIEW COMPARISON:  None available FINDINGS: Lungs are clear. Heart size and mediastinal contours are within normal limits. No effusion. Visualized skeletal structures are unremarkable. Catheters project bilaterally in the region of the renal collecting systems. IMPRESSION: No acute cardiopulmonary disease. Electronically Signed   By: Lucrezia Europe M.D.   On: 08/19/2015 14:36   Ct Head Wo Contrast  08/19/2015  CLINICAL DATA:  38 year old female with frontal headache and fever. EXAM: CT HEAD WITHOUT CONTRAST TECHNIQUE: Contiguous axial images  were obtained from the base of the skull through the vertex without intravenous contrast. COMPARISON:  None. FINDINGS: The ventricles and the sulci are appropriate in size for the patient's age. There is no intracranial hemorrhage. No midline shift or mass effect identified. The gray-white matter differentiation is preserved. The visualized paranasal sinuses and mastoid air cells are well aerated. The calvarium is intact. IMPRESSION: No acute intracranial pathology. Electronically Signed   By: Anner Crete M.D.   On: 08/19/2015 19:13   I have personally reviewed and evaluated these images and lab results as part of my medical decision-making.   EKG Interpretation None      MDM   Final diagnoses:  UTI (lower urinary tract infection)    Patient has hypokalemia and urinary tract infection. She will be sent home with Keflex, Zofran, Vicodin and potassium. And she will follow-up with her PCP    Milton Ferguson, MD 08/19/15 1943

## 2015-08-21 LAB — URINE CULTURE

## 2015-08-22 ENCOUNTER — Telehealth (HOSPITAL_COMMUNITY): Payer: Self-pay

## 2015-08-22 NOTE — Telephone Encounter (Signed)
Post ED Visit - Positive Culture Follow-up  Culture report reviewed by antimicrobial stewardship pharmacist:  []  Elenor Quinones, Pharm.D. []  Heide Guile, Pharm.D., BCPS []  Parks Neptune, Pharm.D. []  Alycia Rossetti, Pharm.D., BCPS []  [place], Pharm.D., BCPS, AAHIVP []  Legrand Como, Pharm.D., BCPS, AAHIVP []  Milus Glazier, Pharm.D. []  Stephens November, [place].D. Tona Sensing, Pharm.D.  Positive urine culture, >/= 100,000 colonies -> Staph. Aureus Treated with Cephalexin, organism sensitive to the same and no further patient follow-up is required at this time.  Dortha Kern 08/22/2015, 11:19 AM

## 2015-08-24 LAB — CULTURE, BLOOD (ROUTINE X 2)
CULTURE: NO GROWTH
CULTURE: NO GROWTH

## 2015-09-27 ENCOUNTER — Other Ambulatory Visit: Payer: Self-pay

## 2015-09-27 DIAGNOSIS — Z1231 Encounter for screening mammogram for malignant neoplasm of breast: Secondary | ICD-10-CM

## 2015-09-27 LAB — PROCEDURE REPORT - SCANNED: Pap: NEGATIVE

## 2015-10-16 ENCOUNTER — Ambulatory Visit: Payer: Medicaid Other

## 2016-05-21 ENCOUNTER — Encounter: Payer: Self-pay | Admitting: *Deleted

## 2016-08-17 ENCOUNTER — Emergency Department (HOSPITAL_COMMUNITY)
Admission: EM | Admit: 2016-08-17 | Discharge: 2016-08-17 | Disposition: A | Discharge status: Home / Self Care | Payer: Medicaid Other | Attending: Emergency Medicine | Admitting: Emergency Medicine

## 2016-08-17 ENCOUNTER — Encounter (HOSPITAL_COMMUNITY): Payer: Self-pay

## 2016-08-17 DIAGNOSIS — J019 Acute sinusitis, unspecified: Secondary | ICD-10-CM

## 2016-08-17 DIAGNOSIS — F17210 Nicotine dependence, cigarettes, uncomplicated: Secondary | ICD-10-CM | POA: Insufficient documentation

## 2016-08-17 DIAGNOSIS — H6501 Acute serous otitis media, right ear: Secondary | ICD-10-CM

## 2016-08-17 DIAGNOSIS — H65191 Other acute nonsuppurative otitis media, right ear: Secondary | ICD-10-CM | POA: Insufficient documentation

## 2016-08-17 MED ORDER — AMOXICILLIN-POT CLAVULANATE 875-125 MG PO TABS: 1.0000 | ORAL_TABLET | Freq: Two times a day (BID) | ORAL | 0 refills | Status: DC

## 2016-08-17 NOTE — ED Provider Notes (Signed)
[facility] DEPT Provider Note   CSN: [id number] Arrival date & time: 08/17/16  1010  By signing my name below, I, Higinio Plan, attest that this documentation has been prepared under the direction and in the presence of non-physician practitioner, [address] Jartavious Mckimmy, PA-C. Electronically Signed: Higinio Plan, Scribe. 08/17/2016. 11:08 AM.  History   Chief Complaint Chief Complaint  Patient presents with  . Sinus Pressure  . Otalgia   The history is provided by the patient and medical records. No language interpreter was used.  Otalgia  This is a new problem. The current episode started 3 to 5 hours ago. There is pain in the right ear. The problem occurs constantly. The problem has been gradually worsening. There has been no fever. The pain is at a severity of 5/10. The pain is mild. Associated symptoms include sore throat. Pertinent negatives include no ear discharge, no rhinorrhea, no abdominal pain, no diarrhea, no vomiting, no cough and no rash.   HPI Comments: Pamela Pace is a 40 y.o. female with PMHx of anemia, who presents to the Emergency Department for an evaluation of gradually worsening sinus pressure that began 4 days ago and R ear pain that began this morning. Pt describes her ear pain as 5/10, intermittent, "throbbing," non-radiating, R ear pain that is exacerbated with movement. She states associated sore throat, chills, postnasal drip, and myalgias in her bilateral legs. She notes she used saline drops for her sinus pressure a few days ago with mild relief; however, she used ibupforen and NyQuil yesterday with no relief. Denies fevers, CP, SOB, ear discharge, cough, drooling, difficulty swallowing, trismus, abd pain, N/V/D/C, hematuria, dysuria, arthralgias, numbness, tingling, weakness, or rashes, or any other complaints. +Sick contacts, works at [employer].    Past Medical History:  Diagnosis Date  . Anemia   . Fibroids 2012   Patient Active Problem List   Diagnosis Date Noted    . Intramural leiomyoma of uterus   . Fibroids 12/15/2014  . Uterine fibroid 12/14/2014  . Fibroid, uterine    Past Surgical History:  Procedure Laterality Date  . CESAREAN SECTION  2008   OB History    No data available     Home Medications    Prior to Admission medications   Medication Sig Start Date End Date Taking? Authorizing Provider  cephALEXin (KEFLEX) 500 MG capsule Take 1 capsule (500 mg total) by mouth 4 (four) times daily. 08/19/15   Milton Ferguson, MD  chlorhexidine (PERIDEX) 0.12 % solution 15 mLs by Mouth Rinse route 2 (two) times daily. 12/15/14   Darrell K Allred, PA-C  chlorthalidone (HYGROTON) 25 MG tablet Take 12.5 mg by mouth at bedtime.    Historical Provider, MD  cholecalciferol (VITAMIN D) 1000 UNITS tablet Take 1,000 Units by mouth daily.    Historical Provider, MD  ferrous sulfate 325 (65 FE) MG EC tablet Take 325 mg by mouth daily with breakfast.    Historical Provider, MD  fluticasone (FLONASE) 50 MCG/ACT nasal spray Place 1 spray into both nostrils daily. 09/20/14   Historical Provider, MD  HYDROcodone-acetaminophen (NORCO/VICODIN) 5-325 MG tablet Take 1 tablet by mouth every 6 (six) hours as needed for moderate pain. 08/19/15   Milton Ferguson, MD  ibuprofen (ADVIL,MOTRIN) 200 MG tablet Take 600 mg by mouth every 6 (six) hours as needed for mild pain.     Historical Provider, MD  Inulin (FIBER CHOICE FRUITY BITES PO) Take 1 tablet by mouth daily.    Historical Provider, MD  Multiple Vitamin (MULTIVITAMIN  WITH MINERALS) TABS tablet Take 1 tablet by mouth daily.    Historical Provider, MD  ondansetron (ZOFRAN ODT) 4 MG disintegrating tablet 4mg  ODT q4 hours prn nausea/vomit 08/19/15   Milton Ferguson, MD  potassium chloride SA (K-DUR,KLOR-CON) 20 MEQ tablet Take 1 tablet (20 mEq total) by mouth daily. 08/19/15   Milton Ferguson, MD    Family History History reviewed. No pertinent family history.  Social History Social History  Substance Use Topics  . Smoking  status: Current Every Day Smoker    Types: Cigarettes  . Smokeless tobacco: Never Used  . Alcohol use No     Allergies   Shellfish allergy and Dilaudid [hydromorphone hcl]   Review of Systems Review of Systems  Constitutional: Positive for chills. Negative for fever.  HENT: Positive for congestion, ear pain, postnasal drip, sinus pressure and sore throat. Negative for drooling, ear discharge, rhinorrhea and trouble swallowing.   Respiratory: Negative for cough and shortness of breath.   Cardiovascular: Negative for chest pain.  Gastrointestinal: Negative for abdominal pain, constipation, diarrhea, nausea and vomiting.  Genitourinary: Negative for dysuria and hematuria.  Musculoskeletal: Positive for myalgias. Negative for arthralgias.  Skin: Negative for color change and rash.  Allergic/Immunologic: Negative for immunocompromised state.  Neurological: Negative for weakness and numbness.  Psychiatric/Behavioral: Negative for confusion.  10 systems reviewed and all are negative for acute change except as noted in the HPI.  Physical Exam Updated Vital Signs BP 148/77 (BP Location: Left Arm)   Pulse 84   Temp 98.3 F (36.8 C) (Oral)   Resp 18   LMP 08/02/2016   SpO2 100%   Physical Exam  Constitutional: She is oriented to person, place, and time. Vital signs are normal. She appears well-developed and well-nourished.  Non-toxic appearance. No distress.  Afebrile, nontoxic, NAD  HENT:  Head: Normocephalic and atraumatic.  Right Ear: Hearing, external ear and ear canal normal. Tympanic membrane is bulging. A middle ear effusion (serous) is present.  Left Ear: Hearing, tympanic membrane, external ear and ear canal normal.  Nose: Mucosal edema and rhinorrhea present. Right sinus exhibits maxillary sinus tenderness and frontal sinus tenderness. Left sinus exhibits maxillary sinus tenderness and frontal sinus tenderness.  Mouth/Throat: Uvula is midline, oropharynx is clear and moist  and mucous membranes are normal. No trismus in the jaw. No uvula swelling.  Ear canals are clear bilaterally, left TM clear, right TM bulging with serous effusion but no erythema noted and with good cone of light. Nose with mucosal edema and rhinorrhea, sinus tenderness diffusely. Oropharynx clear and moist, without uvular swelling or deviation, no trismus or drooling, no tonsillar swelling or erythema, no exudates.   Eyes: Conjunctivae and EOM are normal. Right eye exhibits no discharge. Left eye exhibits no discharge.  Neck: Normal range of motion. Neck supple.  Cardiovascular: Normal rate and intact distal pulses.   Pulmonary/Chest: Effort normal. No respiratory distress.  Abdominal: Normal appearance. She exhibits no distension.  Musculoskeletal: Normal range of motion.  Neurological: She is alert and oriented to person, place, and time. She has normal strength. No sensory deficit.  Skin: Skin is warm, dry and intact. No rash noted.  Psychiatric: She has a normal mood and affect. Her behavior is normal.  Nursing note and vitals reviewed.  ED Treatments / Results  Labs (all labs ordered are listed, but only abnormal results are displayed) Labs Reviewed - No data to display  EKG  EKG Interpretation None       Radiology No results  found.  Procedures Procedures (including critical care time)  Medications Ordered in ED Medications - No data to display  DIAGNOSTIC STUDIES:  Oxygen Saturation is 100% on RA, normal by my interpretation.    COORDINATION OF CARE:  11:06 AM Discussed treatment plan with pt at bedside and pt agreed to plan.  Initial Impression / Assessment and Plan / ED Course  I have reviewed the triage vital signs and the nursing notes.  Pertinent labs & imaging results that were available during my care of the patient were reviewed by me and considered in my medical decision making (see chart for details).  Clinical Course     40 y.o. female here with  sinus congestion and R otalgia for several days. On exam, mucosal edema and rhinorrhea, R ear with serous effusion but no definite evidence of infection although could be developing into one since TM is bulging and starting to get slightly erythematous. Will give safety script for augmentin given upcoming holiday, although discussed that this is likely still just from congestion and eustachian tube dysfunction; symptomatic control discussed, and only if fevers or worsening pain develops should she start taking the abx. F/up with PCP in 5-7 days for recheck. I explained the diagnosis and have given explicit precautions to return to the ER including for any other new or worsening symptoms. The patient understands and accepts the medical plan as it's been dictated and I have answered their questions. Discharge instructions concerning home care and prescriptions have been given. The patient is STABLE and is discharged to home in good condition.   I personally performed the services described in this documentation, which was scribed in my presence. The recorded information has been reviewed and is accurate.   Final Clinical Impressions(s) / ED Diagnoses   Final diagnoses:  Acute sinusitis, recurrence not specified, unspecified location  Right acute serous otitis media, recurrence not specified    New Prescriptions Current Discharge Medication List    START taking these medications   Details  amoxicillin-clavulanate (AUGMENTIN) 875-125 MG tablet Take 1 tablet by mouth 2 (two) times daily. One po bid x 7 days. DO NOT START TAKING UNLESS SYMPTOMS WORSEN OR FEVER DEVELOPS Qty: 14 tablet, Refills: 0         Odetta Forness Camprubi-Soms, PA-C 08/17/16 1119    Drenda Freeze, MD 08/17/16 1710

## 2016-08-17 NOTE — ED Triage Notes (Signed)
Pt c/o facial/sinus pressure and pain x 4 days and R ear pain starting this morning.  Pain score 9/10.  Pt reports taking ibuprofen and Nyquil w/ some relief.  Sts pain increases w/ standing.  Pt reports symptoms briefly cleared after using nasal spray a couple days ago.

## 2016-08-17 NOTE — ED Notes (Signed)
Bed: WTR8 Expected date:  Expected time:  Means of arrival:  Comments: 

## 2016-08-17 NOTE — Discharge Instructions (Signed)
Continue to stay well-hydrated. Gargle warm salt water and spit it out. Use chloraseptic spray as needed for sore throat. Continue to alternate between Tylenol and Ibuprofen for pain or fever. Use Mucinex for cough suppression/expectoration of mucus. Use netipot and over the counter flonase to help with nasal congestion. May consider over-the-counter Benadryl or other antihistamine to decrease secretions and for help with your symptoms. You have been given a safety script for an antibiotic that you should hold on to, if symptoms worsen or fever develops, then you can start taking it; if these symptoms do not occur then DO NOT TAKE IT. Follow up with your primary care doctor in 5-7 days for recheck of ongoing symptoms. Return to emergency department for emergent changing or worsening of symptoms.

## 2016-08-28 ENCOUNTER — Encounter: Payer: Self-pay | Admitting: Interventional Radiology

## 2016-12-18 IMAGING — XA IR US GUIDE VASC ACCESS RIGHT
8 of 9 series · 14 of 24 positions shown · IV contrast (IODINE)
Comparison: none

CLINICAL DATA: Uterine fibroids

[Series 1: (id) body · 2 acquisitions, 3 frames shown (1 of 2)]
[im 1/2]
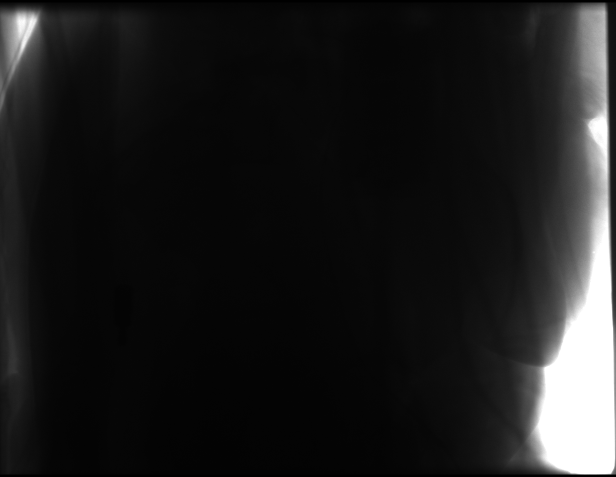
[im 1/2  full-range]
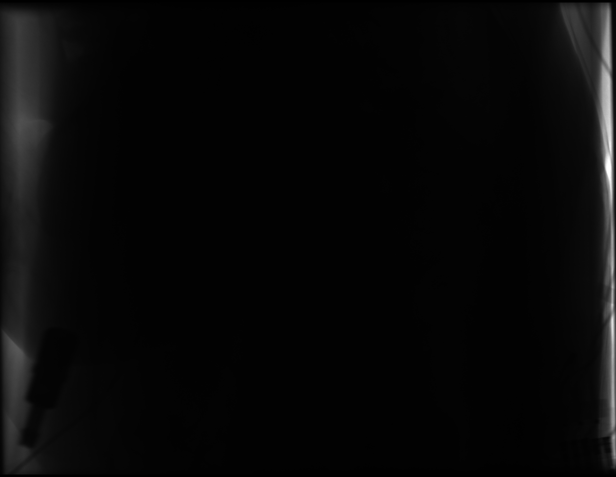
[im 2/2]
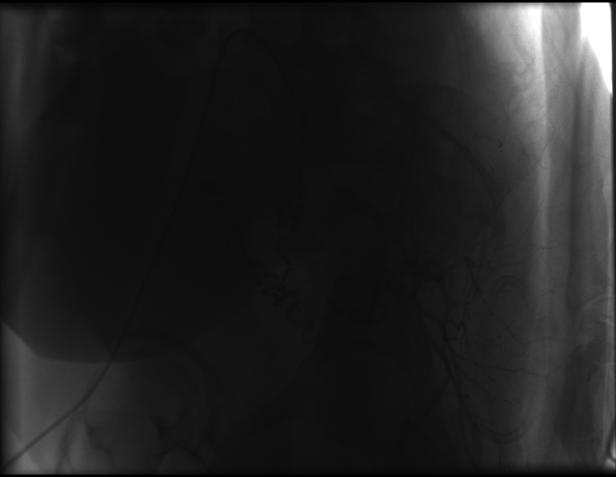

[Series 2: care body 4 · 2 of 28 frames shown (1 of 2)]
[frame 15/28]
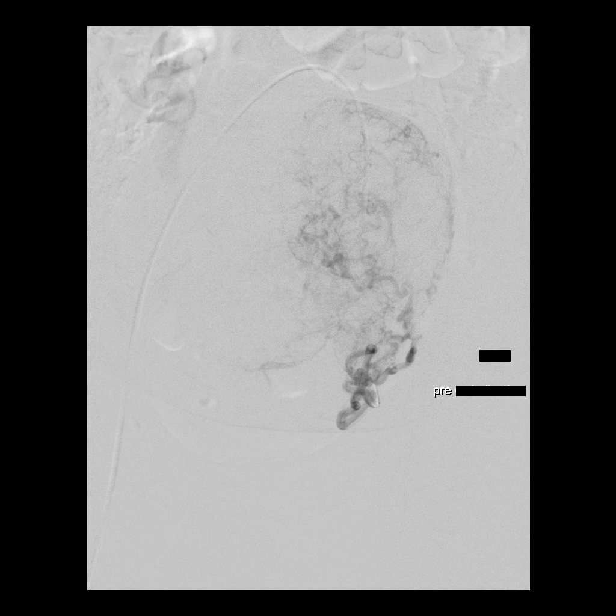
[frame 24/28]
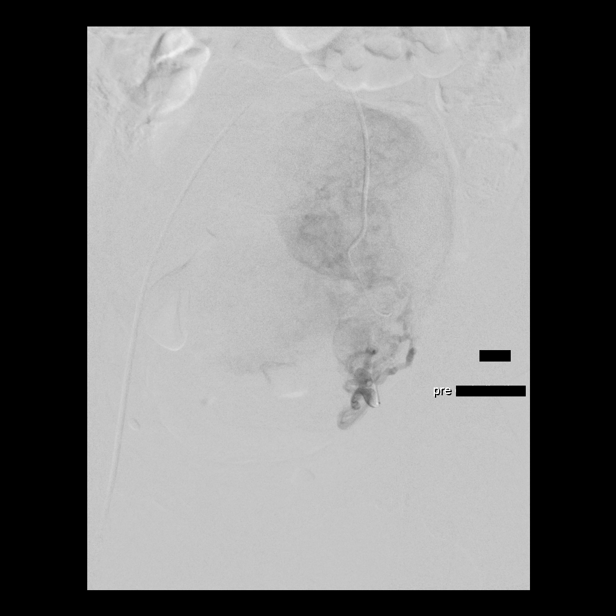

[Series 3: care body 4 · 1 of 14 frames shown (2 of 2)]
[frame 11/14]
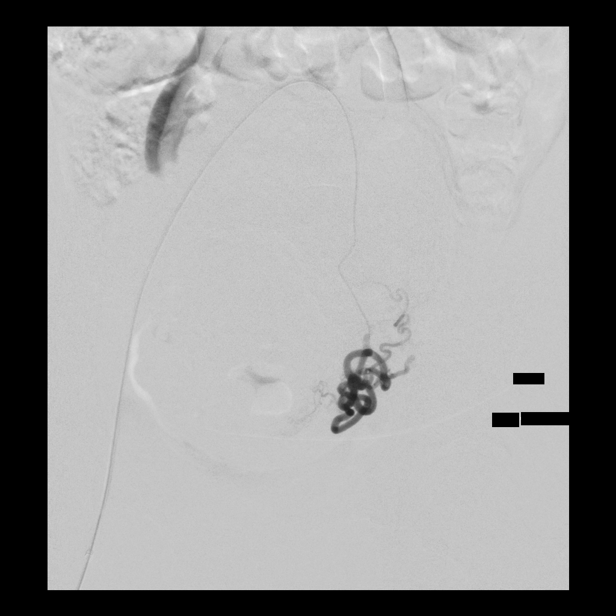

[Series 5: (id) body · 2 acquisitions, 3 frames shown (2 of 2)]
[im 1/2]
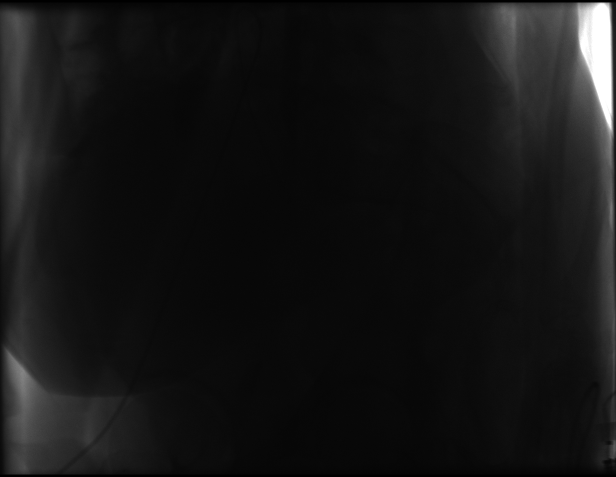
[im 1/2]
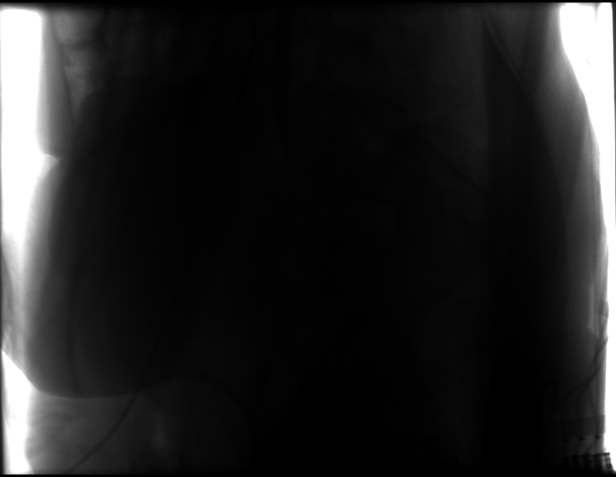
[im 2/2]
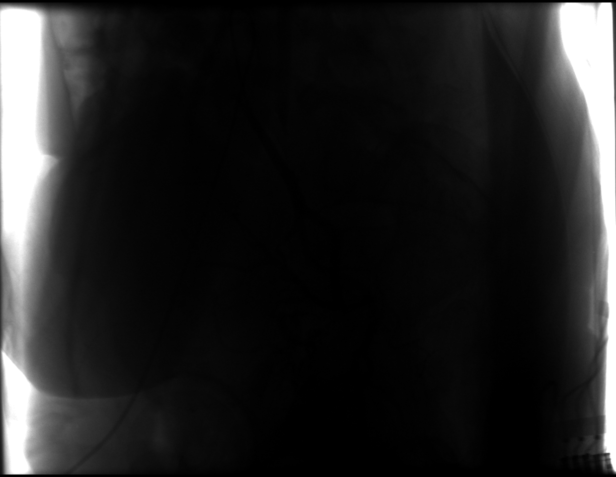

[Series 6: body 4 · 1 of 22 frames shown (1 of 2)]
[frame 13/22]
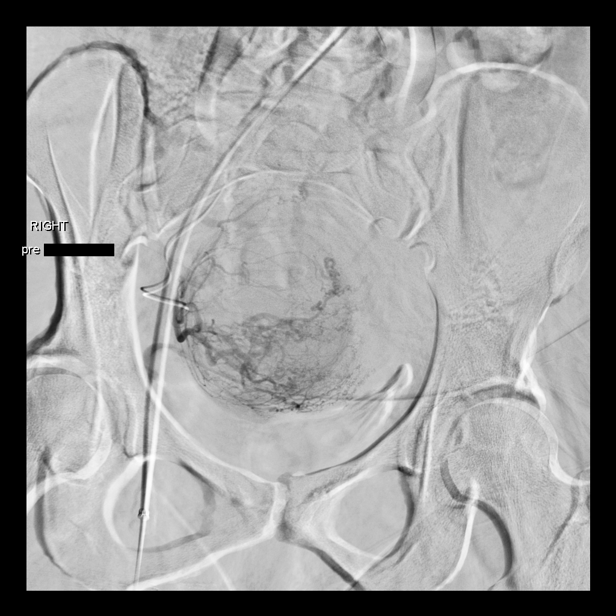

[Series 7: body 4 · 2 of 8 frames shown (2 of 2)]
[frame 3/8]
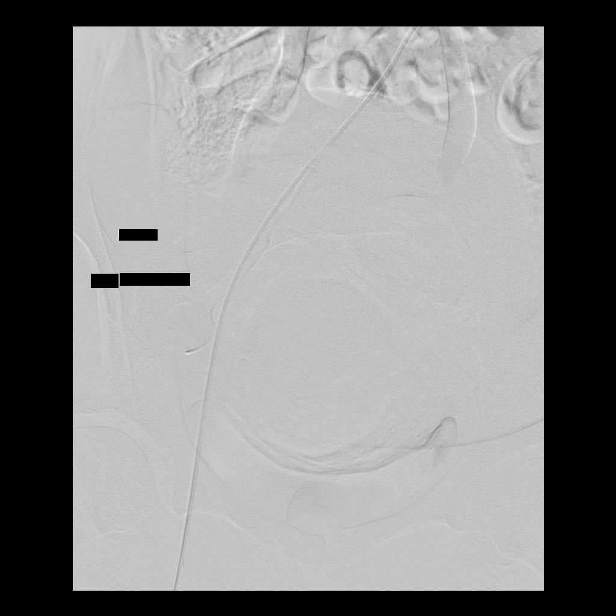
[frame 7/8]
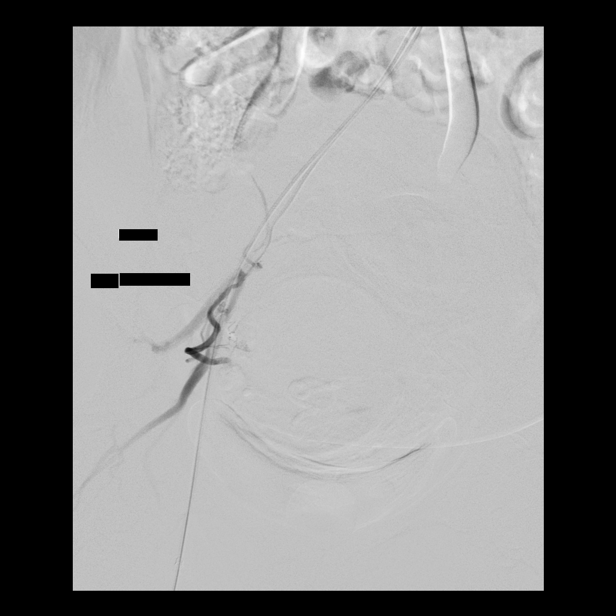

[Series 991: right uterine artery · 0.20mm/px · 1 of 3 slices shown]
[im 2/3]
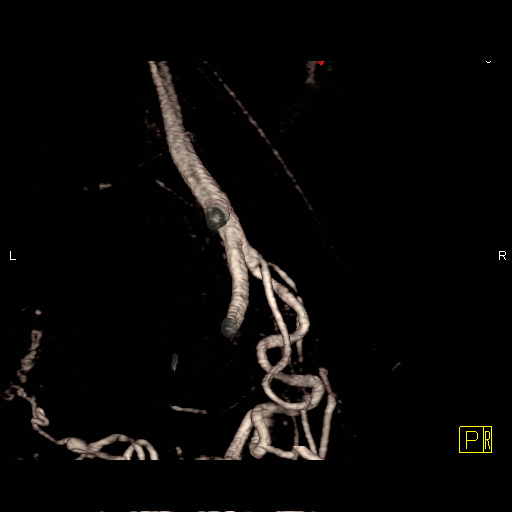

[Series 992: left uterine artery · 0.20mm/px · 1 of 2 slices shown]
[im 2/2]
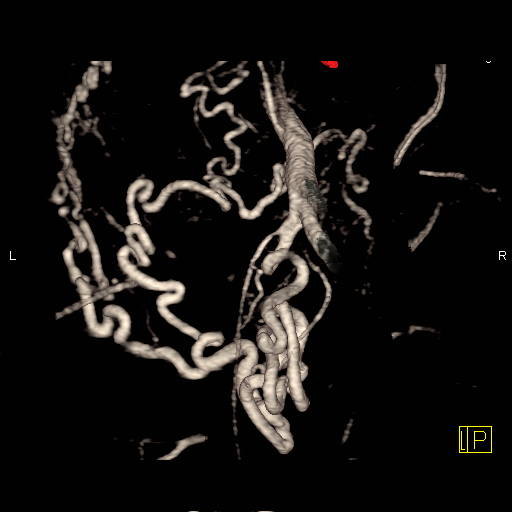

[14 of 24 positions shown; findings below may reference images not displayed]

EXAM:
UTERINE ARTERY EMBOLIZATION

FLUOROSCOPY TIME:  32 minutes and 12 seconds.

MEDICATIONS AND MEDICAL HISTORY:
Versed 4 mg, Fentanyl 150 mcg.

As antibiotic prophylaxis, Ancef was ordered pre-procedure and
administered intravenously within one hour of incision.

Allergies: Seafood

ANESTHESIA/SEDATION:
Moderate sedation time: 82 minutes

CONTRAST:  50 cc Omnipaque 300

PROCEDURE:
Vessels selected:  Iliac artery third order bilaterally.

The procedure, risks, benefits, and alternatives were explained to
the patient. Questions regarding the procedure were encouraged and
answered. The patient understands and consents to the procedure.

The right groin was prepped with Betadine in a sterile fashion, and
a sterile drape was applied covering the operative field. A sterile
gown and sterile gloves were used for the procedure.

A 19 gauge needle was inserted into the right common femoral artery
and removed over Elsy Dove. A 5-French sheath was inserted. Cobra II
catheter was advanced into the aorta. The contralateral left common
iliac artery was selected. The internal iliac artery on the left was
selected. 3D angiography was performed. A micro catheter was
advanced over an 018 glide wire into the left uterine artery.
Angiography was performed.

Embolization was performed utilizing0 vials 500-700 micron
embospheres and [DATE] vial 700-900 micron embospheres.

Repeat angiogram was performed.

The micro catheter was removed and flushed. Kvavilebi Husenov loop was
created. The ipsilateral right common iliac artery and right
internal iliac artery were selected. 3D angio was performed. A micro
catheter was advanced over a 9934 glide wire into the right uterine
artery. Angiography was performed.

Embolization was again performed with2 vials 500-700 micron
embospheres.

Followup angiography was performed.

The micro catheter and Cobra catheter were removed. The sheath was
removed. Hemostasis was achieved with direct pressure.
FINDINGS: Imaging demonstrates bilateral pelvic anatomy and bilateral uterine
artery angiography. Expected anatomy is identified. No blood supply
from the uterine artery to the vagina is visualized.

Post embolization angiography demonstrates relative stasis of flow
and pruning of the vasculature to the uterus.

COMPLICATIONS:
None
IMPRESSION: Successful bilateral uterine artery embolization.
EXAM:
UTERINE ARTERY EMBOLIZATION

FLUOROSCOPY TIME:  32 minutes and 12 seconds.

MEDICATIONS AND MEDICAL HISTORY:
Versed 4 mg, Fentanyl 150 mcg.

As antibiotic prophylaxis, Ancef was ordered pre-procedure and
administered intravenously within one hour of incision.

Allergies: Seafood

ANESTHESIA/SEDATION:
Moderate sedation time: 82 minutes

CONTRAST:  50 cc Omnipaque 300

PROCEDURE:
Vessels selected:  Iliac artery third order bilaterally.

The procedure, risks, benefits, and alternatives were explained to
the patient. Questions regarding the procedure were encouraged and
answered. The patient understands and consents to the procedure.

The right groin was prepped with Betadine in a sterile fashion, and
a sterile drape was applied covering the operative field. A sterile
gown and sterile gloves were used for the procedure.

A 19 gauge needle was inserted into the right common femoral artery
and removed over Merrilee Tanaka. Jigisha 5-French sheath was inserted. Cobra II
catheter was advanced into the aorta. The contralateral left common
iliac artery was selected. The internal iliac artery on the left was
selected. 3D angiography was performed. A micro catheter was
advanced over an 018 glide wire into the left uterine artery.
Angiography was performed.

Embolization was performed utilizing4 vials 500-700 micron
embospheres and [DATE] vial 700-900 micron embospheres.

Repeat angiogram was performed.

The micro catheter was removed and flushed. Auntyjatty Delowr loop was
created. The ipsilateral right common iliac artery and right
internal iliac artery were selected. 3D angio was performed. A micro
catheter was advanced over a 9997 glide wire into the right uterine
artery. Angiography was performed.

Embolization was again performed with9 vials 500-700 micron
embospheres.

Followup angiography was performed.

The micro catheter and Cobra catheter were removed. The sheath was
removed. Hemostasis was achieved with direct pressure.
FINDINGS: Imaging demonstrates bilateral pelvic anatomy and bilateral uterine
artery angiography. Expected anatomy is identified. No blood supply
from the uterine artery to the vagina is visualized.

Post embolization angiography demonstrates relative stasis of flow
and pruning of the vasculature to the uterus.

COMPLICATIONS:
None
IMPRESSION: Successful bilateral uterine artery embolization.

## 2017-04-04 ENCOUNTER — Ambulatory Visit (INDEPENDENT_AMBULATORY_CARE_PROVIDER_SITE_OTHER): Payer: Self-pay | Admitting: Internal Medicine

## 2017-04-04 ENCOUNTER — Encounter: Payer: Self-pay | Admitting: Internal Medicine

## 2017-04-04 VITALS — BP 140/90 | HR 66 | Resp 12 | Ht 62.5 in | Wt 144.0 lb

## 2017-04-04 DIAGNOSIS — Z720 Tobacco use: Secondary | ICD-10-CM

## 2017-04-04 DIAGNOSIS — R5383 Other fatigue: Secondary | ICD-10-CM

## 2017-04-04 DIAGNOSIS — R739 Hyperglycemia, unspecified: Secondary | ICD-10-CM

## 2017-04-04 DIAGNOSIS — R8290 Unspecified abnormal findings in urine: Secondary | ICD-10-CM

## 2017-04-04 DIAGNOSIS — Z87440 Personal history of urinary (tract) infections: Secondary | ICD-10-CM

## 2017-04-04 LAB — GLUCOSE, POCT (MANUAL RESULT ENTRY): POC Glucose: 96 mg/dl (ref 70–99)

## 2017-04-04 MED ORDER — FERROUS GLUCONATE 324 (38 FE) MG PO TABS: ORAL_TABLET | ORAL | 3 refills | Status: DC

## 2017-04-04 NOTE — Patient Instructions (Addendum)
Tobacco Cessation:   1800QUITNOW or [phone], the former for support and possibly free nicotine patches/gum and support; the latter for [facility] Smoking cessation class. Get rid of all smoking supplies:  Cigarettes, lighters, ashtrays--no stashes just in case at home if you are serious.    For nicotine patches:  Stop smoking anything the day you start the first patch Start with 21 mg patch and reapply new to different area of skin every 24 hours for 30 days. Then 14 mg patch changed every 24 hours for 14 days. Then 7 mg patch changed every 24 hours for 14 days.  Can google "advance directives, [place]"  And bring up form from Secretary of [place]. Print and fill out Or can go to "5 wishes"  Which is also in Spanish and fill out--this costs $5--perhaps easier to use. Designate a Medical Power of Attorney to speak for you if you are unable to speak for yourself when ill or injured

## 2017-04-04 NOTE — Progress Notes (Signed)
Subjective:    Patient ID: Pamela Pace, female    DOB: [birth year], 41 y.o.   MRN: [id number]  HPI   1.  Prediabetes:  Diagnosed with prediabetes in January of 2018.  States in low 120s fasting. Did have gestational DM previously as well. Has made changes with diet.  Has not really made changes to physical activity, however.    2.  Fatigue:  For 3-4 months.  Periods are regular.  Last 4 days.  Alternates every other month with heavy periods.  Can have tiny clots at times.   No melena or hematochezia.  No family history of thyroid disease, no personal history of thyroid disease. Sleeps about 5 hours per night.   Works 3rd shift:  11 p.m. To 7 a.m. In morning, goes to bed at 8:30 a.m. Until 12:30 - 1 p.m. History also of iron deficiency--thinks she was around 8 or 9.  Not taking iron as causes constipation.  Has only tried ferrous sulfate.    3.  History of UTI back in January and would like to have urine rechecked to make sure this is cleared.  Not really having any symptoms as she did in January--maybe frequency.   4.  Elevated BP: History of PIH with last baby--11 years ago.  Is anxious.  Does have family history of hypertension.  5.  Smoker:  Divorced just finalized after 4 year separation this April 2018.  Has tried Wellbutrin in past. Has tried Chantix.  Smoked through both and had sleepiness with the former and headaches with the latter.    Current Meds  Medication Sig  . Multiple Vitamin (MULTIVITAMIN WITH MINERALS) TABS tablet Take 1 tablet by mouth daily.    Allergies  Allergen Reactions  . Shellfish Allergy Anaphylaxis and Nausea And Vomiting    Throat closes up  . Dilaudid [Hydromorphone Hcl] Hives and Itching    Past Medical History:  Diagnosis Date  . Anemia   . Fibroids 2012    Past Surgical History:  Procedure Laterality Date  . CESAREAN SECTION  2008  . IR GENERIC HISTORICAL  10/04/2014   IR RADIOLOGIST EVAL & MGMT 10/04/2014 Marybelle Killings, MD [facility] INTERV  RAD: Uterine fibroid embolization    Family History  Problem Relation Age of Onset  . Heart disease Father        ischemic CHF  . Hypertension Father   . Hyperlipidemia Father   . Hypertension Brother   . Lupus Sister   . Hypertension Brother   . Hypertension Brother   . Drug abuse Brother        prediabetes  . Hypertension Brother   . Hypertension Brother   . Asthma Daughter    Social History   Social History  . Marital status: Married    Spouse name: N/A  . Number of children: 6  . Years of education: Associates degree --working on this in nursing.   Occupational History  . Nursing assistance:  [employer]    Social History Main Topics  . Smoking status: Current Every Day Smoker    Packs/day: 1.00    Years: 26.00    Types: Cigarettes  . Smokeless tobacco: Never Used  . Alcohol use No  . Drug use: No  . Sexual activity: No   Other Topics Concern  . Not on file   Social History Narrative   Originally from [place].     Also lived in [place] to [place] 2011--moved back to [place] when  mother ill.   Returned in 2015.     Lives at home with 3 youngest children   Other children off to higher education and beyond.      Review of Systems     Objective:   Physical Exam  Appears fatigued Skin:  Loss of pigmentation on right side of body--states this is a birthmark HEENT: PERRL, EOMI, palpebral conjunctivae pale.  Discs sharp.  TMs pearly gray, throat without injection.   Neck:  Supple, No adenopathy, no thyromegaly Chest:  CTA CV:  RRR with normal S1 and S2, No S3, S4 or murmur.  No carotid bruits, carotid, radial and DP pulses normal and equal Abd:  S, NT, No HSM or mass, + BS LE:  No edema.        Assessment & Plan:  1.  Fatigue:  Possible anemia. Check CBC, CMP, TSH.  2.  Prediabetes history:  nonfasting glucose normal at 96 today.  CMP  3.  History of UTI: urine looks abnormal visually and on UA.  Sending for culture.  If +  for UTI, will have patient return for recheck of urine at end of treatment to test for clearance.  4.  Borderline BP:  Follow for now--CMP  5.  Tobacco abuse:  Oral medications have not worked well for her in past.  Discussed treatments would not remove all craving.  Needs to get rid of smoking paraphernalia and recognize will likely have cravings at times and need to set herself up for success.   Support information and nicotine patch information discussed and given.  Followup in 3 months with me

## 2017-04-07 LAB — CBC WITH DIFFERENTIAL/PLATELET
BASOS ABS: 0 10*3/uL (ref 0.0–0.2)
Basos: 0 %
EOS (ABSOLUTE): 0.1 10*3/uL (ref 0.0–0.4)
Eos: 2 %
HEMOGLOBIN: 11.9 g/dL (ref 11.1–15.9)
Hematocrit: 38.7 % (ref 34.0–46.6)
IMMATURE GRANS (ABS): 0 10*3/uL (ref 0.0–0.1)
Immature Granulocytes: 0 %
LYMPHS: 38 %
Lymphocytes Absolute: 2.6 10*3/uL (ref 0.7–3.1)
MCH: 26.7 pg (ref 26.6–33.0)
MCHC: 30.7 g/dL — ABNORMAL LOW (ref 31.5–35.7)
MCV: 87 fL (ref 79–97)
Monocytes Absolute: 0.4 10*3/uL (ref 0.1–0.9)
Monocytes: 7 %
NEUTROS ABS: 3.6 10*3/uL (ref 1.4–7.0)
Neutrophils: 53 %
Platelets: 387 10*3/uL — ABNORMAL HIGH (ref 150–379)
RBC: 4.45 x10E6/uL (ref 3.77–5.28)
RDW: 15.1 % (ref 12.3–15.4)
WBC: 6.8 10*3/uL (ref 3.4–10.8)

## 2017-04-07 LAB — COMPREHENSIVE METABOLIC PANEL
A/G RATIO: 1.5 (ref 1.2–2.2)
ALT: 7 IU/L (ref 0–32)
AST: 12 IU/L (ref 0–40)
Albumin: 4.3 g/dL (ref 3.5–5.5)
Alkaline Phosphatase: 77 IU/L (ref 39–117)
BILIRUBIN TOTAL: 0.3 mg/dL (ref 0.0–1.2)
BUN/Creatinine Ratio: 10 (ref 9–23)
BUN: 8 mg/dL (ref 6–24)
CHLORIDE: 105 mmol/L (ref 96–106)
CO2: 23 mmol/L (ref 20–29)
Calcium: 9.5 mg/dL (ref 8.7–10.2)
Creatinine, Ser: 0.84 mg/dL (ref 0.57–1.00)
GFR calc non Af Amer: 85 mL/min/{1.73_m2} (ref >59)
GFR, EST AFRICAN AMERICAN: 98 mL/min/{1.73_m2} (ref >59)
GLOBULIN, TOTAL: 2.8 g/dL (ref 1.5–4.5)
Glucose: 69 mg/dL (ref 65–99)
POTASSIUM: 4.6 mmol/L (ref 3.5–5.2)
Sodium: 141 mmol/L (ref 134–144)
TOTAL PROTEIN: 7.1 g/dL (ref 6.0–8.5)

## 2017-04-07 LAB — TSH: TSH: 0.543 u[IU]/mL (ref 0.450–4.500)

## 2017-04-07 LAB — URINE CULTURE

## 2017-04-08 ENCOUNTER — Encounter: Payer: Self-pay | Admitting: Internal Medicine

## 2017-04-08 ENCOUNTER — Other Ambulatory Visit: Payer: Self-pay

## 2017-04-08 DIAGNOSIS — Z720 Tobacco use: Secondary | ICD-10-CM

## 2017-04-08 HISTORY — DX: Tobacco use: Z72.0

## 2017-04-08 LAB — POCT URINALYSIS DIPSTICK
BILIRUBIN UA: NEGATIVE
Glucose, UA: NEGATIVE
KETONES UA: NEGATIVE
NITRITE UA: POSITIVE
PROTEIN UA: 15
Spec Grav, UA: 1.02 (ref 1.010–1.025)
Urobilinogen, UA: 0.2 E.U./dL
pH, UA: 6 (ref 5.0–8.0)

## 2017-04-08 MED ORDER — CIPROFLOXACIN HCL 500 MG PO TABS: 500.0000 mg | ORAL_TABLET | Freq: Two times a day (BID) | ORAL | 0 refills | Status: DC

## 2017-05-07 ENCOUNTER — Other Ambulatory Visit: Payer: Self-pay

## 2017-07-07 ENCOUNTER — Ambulatory Visit: Payer: Self-pay | Admitting: Internal Medicine

## 2018-09-29 ENCOUNTER — Ambulatory Visit: Payer: Self-pay | Admitting: Internal Medicine

## 2019-03-29 ENCOUNTER — Ambulatory Visit: Payer: Medicaid Other | Admitting: Internal Medicine

## 2019-04-14 ENCOUNTER — Ambulatory Visit: Payer: Medicaid Other | Admitting: Internal Medicine

## 2020-08-10 ENCOUNTER — Other Ambulatory Visit: Payer: Self-pay

## 2020-08-10 ENCOUNTER — Emergency Department (HOSPITAL_COMMUNITY)
Admission: EM | Admit: 2020-08-10 | Discharge: 2020-08-10 | Disposition: A | Discharge status: Home / Self Care | Payer: Self-pay | Attending: Emergency Medicine | Admitting: Emergency Medicine

## 2020-08-10 ENCOUNTER — Encounter (HOSPITAL_COMMUNITY): Payer: Self-pay | Admitting: Obstetrics and Gynecology

## 2020-08-10 DIAGNOSIS — N390 Urinary tract infection, site not specified: Secondary | ICD-10-CM | POA: Insufficient documentation

## 2020-08-10 DIAGNOSIS — D259 Leiomyoma of uterus, unspecified: Secondary | ICD-10-CM | POA: Insufficient documentation

## 2020-08-10 DIAGNOSIS — F17210 Nicotine dependence, cigarettes, uncomplicated: Secondary | ICD-10-CM | POA: Insufficient documentation

## 2020-08-10 LAB — CBC
HCT: 39.2 % (ref 36.0–46.0)
Hemoglobin: 12.6 g/dL (ref 12.0–15.0)
MCH: 27.8 pg (ref 26.0–34.0)
MCHC: 32.1 g/dL (ref 30.0–36.0)
MCV: 86.5 fL (ref 80.0–100.0)
Platelets: 326 10*3/uL (ref 150–400)
RBC: 4.53 MIL/uL (ref 3.87–5.11)
RDW: 15.2 % (ref 11.5–15.5)
WBC: 6.9 10*3/uL (ref 4.0–10.5)
nRBC: 0 % (ref 0.0–0.2)

## 2020-08-10 LAB — COMPREHENSIVE METABOLIC PANEL
ALT: 16 U/L (ref 0–44)
AST: 18 U/L (ref 15–41)
Albumin: 4 g/dL (ref 3.5–5.0)
Alkaline Phosphatase: 70 U/L (ref 38–126)
Anion gap: 8 (ref 5–15)
BUN: 11 mg/dL (ref 6–20)
CO2: 27 mmol/L (ref 22–32)
Calcium: 9.1 mg/dL (ref 8.9–10.3)
Chloride: 101 mmol/L (ref 98–111)
Creatinine, Ser: 0.9 mg/dL (ref 0.44–1.00)
GFR, Estimated: >60 mL/min (ref >60)
Glucose, Bld: 118 mg/dL — ABNORMAL HIGH (ref 70–99)
Potassium: 4 mmol/L (ref 3.5–5.1)
Sodium: 136 mmol/L (ref 135–145)
Total Bilirubin: 0.6 mg/dL (ref 0.3–1.2)
Total Protein: 7.6 g/dL (ref 6.5–8.1)

## 2020-08-10 LAB — URINALYSIS, ROUTINE W REFLEX MICROSCOPIC
Bilirubin Urine: NEGATIVE
Glucose, UA: NEGATIVE mg/dL
Ketones, ur: NEGATIVE mg/dL
Nitrite: POSITIVE — AB
Protein, ur: 100 mg/dL — AB
RBC / HPF: >50 RBC/hpf — ABNORMAL HIGH (ref 0–5)
Specific Gravity, Urine: 1.011 (ref 1.005–1.030)
WBC, UA: >50 WBC/hpf — ABNORMAL HIGH (ref 0–5)
pH: 7 (ref 5.0–8.0)

## 2020-08-10 LAB — I-STAT BETA HCG BLOOD, ED (MC, WL, AP ONLY): I-stat hCG, quantitative: <5 m[IU]/mL (ref <5)

## 2020-08-10 LAB — LIPASE, BLOOD: Lipase: 43 U/L (ref 11–51)

## 2020-08-10 MED ORDER — CEPHALEXIN 500 MG PO CAPS: 500.0000 mg | ORAL_CAPSULE | Freq: Three times a day (TID) | ORAL | 0 refills | Status: DC

## 2020-08-10 MED ORDER — CEPHALEXIN 500 MG PO CAPS: 500.0000 mg | ORAL_CAPSULE | Freq: Four times a day (QID) | ORAL | 0 refills | Status: DC

## 2020-08-10 MED ORDER — CEPHALEXIN 500 MG PO CAPS
500.0000 mg | ORAL_CAPSULE | Freq: Once | ORAL | Status: AC
  Administered 2020-08-10: 18:00:00 500 mg via ORAL
  Filled 2020-08-10: qty 1

## 2020-08-10 MED ORDER — ONDANSETRON 4 MG PO TBDP: 4.0000 mg | ORAL_TABLET | Freq: Three times a day (TID) | ORAL | 0 refills | Status: DC | PRN

## 2020-08-10 MED ORDER — KETOROLAC TROMETHAMINE 60 MG/2ML IM SOLN
60.0000 mg | Freq: Once | INTRAMUSCULAR | Status: AC
  Administered 2020-08-10: 17:00:00 60 mg via INTRAMUSCULAR
  Filled 2020-08-10: qty 2

## 2020-08-10 NOTE — ED Provider Notes (Signed)
[facility] DEPT Provider Note   CSN: [id number] Arrival date & time: 08/10/20  1349     History Chief Complaint  Patient presents with  . Abdominal Cramping  . Leg Pain  . Back Pain    Pamela Pace is a 44 y.o. female.  The history is provided by the patient and medical records.   Pamela Pace is a 44 y.o. female who presents to the Emergency Department complaining of abdominal pain, leg pain. Pain began yesterday and is described as constant nature. Pain is located across her low back and is burning and feels hot and shooting down her legs and in her abdomen. She took ibuprofen with no significant change. She has experienced similar episodes in the past that did improve with ibuprofen. Pain is worse with walking. She has associated poor sleep. She also reports pain with urination and bowel movements. She has urinary frequency, malodorous urine. She denies any injuries. No vaginally discharge.No new sexual partners. She did have nausea this morning, no vomiting.     Past Medical History:  Diagnosis Date  . Anemia   . Fibroids 2012  . Tobacco abuse 04/08/2017    Patient Active Problem List   Diagnosis Date Noted  . Tobacco abuse 04/08/2017  . Intramural leiomyoma of uterus   . Fibroids 12/15/2014  . Uterine fibroid 12/14/2014  . Fibroid, uterine     Past Surgical History:  Procedure Laterality Date  . CESAREAN SECTION  2008  . IR GENERIC HISTORICAL  10/04/2014   IR RADIOLOGIST EVAL & MGMT 10/04/2014 Marybelle Killings, MD [facility] INTER[facility] RAD: Uterine fibroid embolization     OB History   No obstetric history on file.     Family History  Problem Relation Age of Onset  . Heart disease Father        ischemic CHF  . Hypertension Father   . Hyperlipidemia Father   . Hypertension Brother   . Lupus Sister   . Hypertension Brother   . Hypertension Brother   . Drug abuse Brother        prediabetes  . Hypertension Brother   . Hypertension  Brother   . Asthma Daughter     Social History   Tobacco Use  . Smoking status: Current Every Day Smoker    Packs/day: 1.00    Years: 26.00    Pack years: 26.00    Types: Cigarettes  . Smokeless tobacco: Never Used  Substance Use Topics  . Alcohol use: No    Alcohol/week: 0.0 standard drinks  . Drug use: No    Home Medications Prior to Admission medications   Medication Sig Start Date End Date Taking? Authorizing Provider  cephALEXin (KEFLEX) 500 MG capsule Take 1 capsule (500 mg total) by mouth 3 (three) times daily. 08/10/20   Quintella Reichert, MD  ciprofloxacin (CIPRO) 500 MG tablet Take 1 tablet (500 mg total) by mouth 2 (two) times daily. For 7 days 04/08/17   Mack Hook, MD  ferrous gluconate (FERGON) 324 MG tablet 1 tab twice daily with half an orange 04/04/17   Mack Hook, MD  Multiple Vitamin (MULTIVITAMIN WITH MINERALS) TABS tablet Take 1 tablet by mouth daily.    [provider]  ondansetron (ZOFRAN ODT) 4 MG disintegrating tablet Take 1 tablet (4 mg total) by mouth every 8 (eight) hours as needed for nausea or vomiting. 08/10/20   Quintella Reichert, MD    Allergies    Shellfish allergy and Dilaudid [hydromorphone hcl]  Review  of Systems   Review of Systems  All other systems reviewed and are negative.   Physical Exam Updated Vital Signs BP (!) 143/86 (BP Location: Left Arm)   Pulse 72   Temp 98.4 F (36.9 C) (Oral)   Resp 18   LMP 07/14/2020 (Approximate)   SpO2 99%   Physical Exam Vitals and nursing note reviewed.  Constitutional:      Appearance: She is well-developed and well-nourished.  HENT:     Head: Normocephalic and atraumatic.  Cardiovascular:     Rate and Rhythm: Normal rate and regular rhythm.     Heart sounds: No murmur heard.   Pulmonary:     Effort: Pulmonary effort is normal. No respiratory distress.     Breath sounds: Normal breath sounds.  Abdominal:     Palpations: Abdomen is soft.     Tenderness: There  is no guarding or rebound.     Comments: Mild generalized abdominal tenderness  Musculoskeletal:        General: No swelling, tenderness or edema.     Comments: 2+ DP pulses bilaterally.  Skin:    General: Skin is warm and dry.  Neurological:     Mental Status: She is alert and oriented to person, place, and time.     Comments: Five out of five strength and bilateral lower extremities and proximal and distal muscle groups. Sensation light touch intact and bilateral lower extremities. Normal gait.  Psychiatric:        Mood and Affect: Mood and affect normal.        Behavior: Behavior normal.     ED Results / Procedures / Treatments   Labs (all labs ordered are listed, but only abnormal results are displayed) Labs Reviewed  COMPREHENSIVE METABOLIC PANEL - Abnormal; Notable for the following components:      Result Value   Glucose, Bld 118 (*)    All other components within normal limits  URINALYSIS, ROUTINE W REFLEX MICROSCOPIC - Abnormal; Notable for the following components:   APPearance CLOUDY (*)    Hgb urine dipstick LARGE (*)    Protein, ur 100 (*)    Nitrite POSITIVE (*)    Leukocytes,Ua LARGE (*)    RBC / HPF >50 (*)    WBC, UA >50 (*)    Bacteria, UA MANY (*)    All other components within normal limits  URINE CULTURE  LIPASE, BLOOD  CBC  I-STAT BETA HCG BLOOD, ED ([facility], [facility], [facility] ONLY)    EKG None  Radiology No results found.  Procedures Procedures (including critical care time)  Medications Ordered in ED Medications  cephALEXin (KEFLEX) capsule 500 mg (has no administration in time range)  ketorolac (TORADOL) injection 60 mg (60 mg Intramuscular Given 08/10/20 1714)    ED Course  I have reviewed the triage vital signs and the nursing notes.  Pertinent labs & imaging results that were available during my care of the patient were reviewed by me and considered in my medical decision making (see chart for details).    MDM Rules/Calculators/A&P                          Pt here for evaluation of abdominal pain, dysuria. She has mild tenderness on examination without peritoneal findings. She does have RBCs in her urine but by history and exam this is not consistent with infected kidney stone. Out appendicitis. Presentation is not consistent with sepsis. Discussed with patient home care  for UTI. Discussed outpatient follow-up and return precautions.  Final Clinical Impression(s) / ED Diagnoses Final diagnoses:  Acute UTI    Rx / DC Orders ED Discharge Orders         Ordered    cephALEXin (KEFLEX) 500 MG capsule  4 times daily,   Status:  Discontinued        08/10/20 1743    ondansetron (ZOFRAN ODT) 4 MG disintegrating tablet  Every 8 hours PRN        08/10/20 1743    cephALEXin (KEFLEX) 500 MG capsule  3 times daily        08/10/20 1744           Quintella Reichert, MD 08/10/20 1757

## 2020-08-10 NOTE — ED Triage Notes (Signed)
Patient reports she is having lower abdominal pain, back pain, and leg pain. Patient reports she used to have severe fibroids but has not had any issues until recently. Patient reports pain with urination and trouble standing and walking. Patient denies falls.

## 2020-08-13 LAB — URINE CULTURE: Culture: >=100000 — AB

## 2020-08-14 ENCOUNTER — Telehealth: Payer: Self-pay | Admitting: Emergency Medicine

## 2020-08-14 NOTE — Telephone Encounter (Signed)
Post ED Visit - Positive Culture Follow-up  Culture report reviewed by antimicrobial stewardship pharmacist: [facility] Team []  [facility], Pharm.D. []  Heide Guile, Pharm.D., BCPS AQ-ID []  Parks Neptune, Pharm.D., BCPS []  Alycia Rossetti, Pharm.D., BCPS []  [place], Pharm.D., BCPS, AAHIVP []  Legrand Como, Pharm.D., BCPS, AAHIVP []  Salome Arnt, PharmD, BCPS []  Johnnette Gourd, PharmD, BCPS []  Hughes Better, PharmD, BCPS []  Leeroy Cha, PharmD []  Laqueta Linden, PharmD, BCPS []  Albertina Parr, PharmD  [place] Team []  Leodis Sias, PharmD []  Lindell Spar, PharmD []  Royetta Asal, PharmD []  Graylin Shiver, Rph []  Rema Fendt) Glennon Mac, PharmD [x]  Arlyn Dunning, PharmD []  Netta Cedars, PharmD []  Dia Sitter, PharmD []  Leone Haven, PharmD []  Gretta Arab, PharmD []  Theodis Shove, PharmD []  Peggyann Juba, PharmD []  Reuel Boom, PharmD   Positive urine culture Treated with cephalexin, organism sensitive to the same and no further patient follow-up is required at this time.  Hazle Nordmann 08/14/2020, 12:19 PM

## 2021-01-04 ENCOUNTER — Other Ambulatory Visit: Payer: Self-pay

## 2021-01-04 ENCOUNTER — Encounter (HOSPITAL_COMMUNITY): Payer: Self-pay

## 2021-01-04 ENCOUNTER — Emergency Department (HOSPITAL_COMMUNITY)

## 2021-01-04 ENCOUNTER — Emergency Department (HOSPITAL_COMMUNITY)
Admission: EM | Admit: 2021-01-04 | Discharge: 2021-01-04 | Disposition: A | Discharge status: Home / Self Care | Attending: Emergency Medicine | Admitting: Emergency Medicine

## 2021-01-04 DIAGNOSIS — Y990 Civilian activity done for income or pay: Secondary | ICD-10-CM | POA: Insufficient documentation

## 2021-01-04 DIAGNOSIS — W51XXXA Accidental striking against or bumped into by another person, initial encounter: Secondary | ICD-10-CM | POA: Diagnosis not present

## 2021-01-04 DIAGNOSIS — M79645 Pain in left finger(s): Secondary | ICD-10-CM | POA: Diagnosis present

## 2021-01-04 DIAGNOSIS — F17210 Nicotine dependence, cigarettes, uncomplicated: Secondary | ICD-10-CM | POA: Diagnosis not present

## 2021-01-04 MED ORDER — NAPROXEN 375 MG PO TABS: 375.0000 mg | ORAL_TABLET | Freq: Two times a day (BID) | ORAL | 0 refills | Status: DC

## 2021-01-04 MED ORDER — NAPROXEN 375 MG PO TABS
375.0000 mg | ORAL_TABLET | ORAL | Status: AC
  Administered 2021-01-04: 375 mg via ORAL
  Filled 2021-01-04: qty 1

## 2021-01-04 MED ORDER — ACETAMINOPHEN 500 MG PO TABS
1000.0000 mg | ORAL_TABLET | Freq: Once | ORAL | Status: AC
  Administered 2021-01-04: 1000 mg via ORAL
  Filled 2021-01-04: qty 2
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
## 2021-09-21 ENCOUNTER — Other Ambulatory Visit: Payer: Self-pay | Admitting: Orthopedic Surgery

## 2021-09-25 ENCOUNTER — Other Ambulatory Visit: Payer: Self-pay

## 2021-09-25 ENCOUNTER — Encounter (HOSPITAL_BASED_OUTPATIENT_CLINIC_OR_DEPARTMENT_OTHER): Payer: Self-pay | Admitting: Orthopedic Surgery

## 2021-09-27 ENCOUNTER — Encounter (HOSPITAL_BASED_OUTPATIENT_CLINIC_OR_DEPARTMENT_OTHER)
Admission: RE | Admit: 2021-09-27 | Discharge: 2021-09-27 | Disposition: A | Discharge status: Home / Self Care | Payer: Medicaid Other | Source: Ambulatory Visit | Attending: Orthopedic Surgery | Admitting: Orthopedic Surgery

## 2021-09-27 DIAGNOSIS — Z01818 Encounter for other preprocedural examination: Secondary | ICD-10-CM | POA: Insufficient documentation

## 2021-09-27 LAB — BASIC METABOLIC PANEL
Anion gap: 10 (ref 5–15)
BUN: 9 mg/dL (ref 6–20)
CO2: 27 mmol/L (ref 22–32)
Calcium: 9.4 mg/dL (ref 8.9–10.3)
Chloride: 101 mmol/L (ref 98–111)
Creatinine, Ser: 0.87 mg/dL (ref 0.44–1.00)
GFR, Estimated: >60 mL/min (ref >60)
Glucose, Bld: 105 mg/dL — ABNORMAL HIGH (ref 70–99)
Potassium: 3.9 mmol/L (ref 3.5–5.1)
Sodium: 138 mmol/L (ref 135–145)

## 2021-09-27 NOTE — Progress Notes (Signed)

## 2021-10-04 ENCOUNTER — Other Ambulatory Visit: Payer: Self-pay

## 2021-10-04 ENCOUNTER — Encounter (HOSPITAL_BASED_OUTPATIENT_CLINIC_OR_DEPARTMENT_OTHER)
Admission: RE | Disposition: A | Discharge status: Home / Self Care | Payer: Self-pay | Source: Home / Self Care | Attending: Orthopedic Surgery

## 2021-10-04 ENCOUNTER — Ambulatory Visit (HOSPITAL_BASED_OUTPATIENT_CLINIC_OR_DEPARTMENT_OTHER): Admitting: Anesthesiology

## 2021-10-04 ENCOUNTER — Ambulatory Visit (HOSPITAL_BASED_OUTPATIENT_CLINIC_OR_DEPARTMENT_OTHER)
Admission: RE | Admit: 2021-10-04 | Discharge: 2021-10-04 | Disposition: A | Discharge status: Home / Self Care | Attending: Orthopedic Surgery | Admitting: Orthopedic Surgery

## 2021-10-04 ENCOUNTER — Encounter (HOSPITAL_BASED_OUTPATIENT_CLINIC_OR_DEPARTMENT_OTHER): Payer: Self-pay | Admitting: Orthopedic Surgery

## 2021-10-04 DIAGNOSIS — F17200 Nicotine dependence, unspecified, uncomplicated: Secondary | ICD-10-CM | POA: Insufficient documentation

## 2021-10-04 DIAGNOSIS — I10 Essential (primary) hypertension: Secondary | ICD-10-CM | POA: Insufficient documentation

## 2021-10-04 DIAGNOSIS — F419 Anxiety disorder, unspecified: Secondary | ICD-10-CM | POA: Insufficient documentation

## 2021-10-04 DIAGNOSIS — S66311A Strain of extensor muscle, fascia and tendon of left index finger at wrist and hand level, initial encounter: Secondary | ICD-10-CM | POA: Diagnosis not present

## 2021-10-04 DIAGNOSIS — X58XXXA Exposure to other specified factors, initial encounter: Secondary | ICD-10-CM | POA: Insufficient documentation

## 2021-10-04 DIAGNOSIS — Z79899 Other long term (current) drug therapy: Secondary | ICD-10-CM

## 2021-10-04 DIAGNOSIS — F32A Depression, unspecified: Secondary | ICD-10-CM | POA: Insufficient documentation

## 2021-10-04 HISTORY — PX: LIGAMENT REPAIR: SHX5444

## 2021-10-04 HISTORY — DX: Depression, unspecified: F32.A

## 2021-10-04 HISTORY — DX: Essential (primary) hypertension: I10

## 2021-10-04 HISTORY — DX: Anxiety disorder, unspecified: F41.9

## 2021-10-04 LAB — POCT PREGNANCY, URINE: Preg Test, Ur: NEGATIVE

## 2021-10-04 SURGERY — REPAIR, LIGAMENT
Anesthesia: General | Site: Thumb | Laterality: Left

## 2021-10-04 MED ORDER — EPHEDRINE SULFATE (PRESSORS) 50 MG/ML IJ SOLN
INTRAMUSCULAR | Status: DC | PRN
  Administered 2021-10-04: 10 mg via INTRAVENOUS

## 2021-10-04 MED ORDER — OXYCODONE HCL 5 MG PO TABS
5.0000 mg | ORAL_TABLET | Freq: Once | ORAL | Status: AC | PRN
  Administered 2021-10-04: 5 mg via ORAL

## 2021-10-04 MED ORDER — MEPERIDINE HCL 25 MG/ML IJ SOLN: 6.2500 mg | INTRAMUSCULAR | Status: DC | PRN

## 2021-10-04 MED ORDER — PROMETHAZINE HCL 25 MG/ML IJ SOLN: 6.2500 mg | INTRAMUSCULAR | Status: DC | PRN

## 2021-10-04 MED ORDER — 0.9 % SODIUM CHLORIDE (POUR BTL) OPTIME
TOPICAL | Status: DC | PRN
Start: 2021-10-04 — End: 2021-10-04
  Administered 2021-10-04: 50 mL

## 2021-10-04 MED ORDER — CEFAZOLIN SODIUM-DEXTROSE 2-4 GM/100ML-% IV SOLN
INTRAVENOUS | Status: AC
  Filled 2021-10-04: qty 100

## 2021-10-04 MED ORDER — DEXAMETHASONE SODIUM PHOSPHATE 4 MG/ML IJ SOLN
INTRAMUSCULAR | Status: DC | PRN
  Administered 2021-10-04: 10 mg via INTRAVENOUS

## 2021-10-04 MED ORDER — LIDOCAINE HCL (CARDIAC) PF 100 MG/5ML IV SOSY
PREFILLED_SYRINGE | INTRAVENOUS | Status: DC | PRN
  Administered 2021-10-04: 60 mg via INTRAVENOUS

## 2021-10-04 MED ORDER — BUPIVACAINE HCL (PF) 0.25 % IJ SOLN
INTRAMUSCULAR | Status: DC | PRN
  Administered 2021-10-04: 5.5 mL

## 2021-10-04 MED ORDER — OXYCODONE HCL 5 MG/5ML PO SOLN: 5.0000 mg | Freq: Once | ORAL | Status: AC | PRN

## 2021-10-04 MED ORDER — CEFAZOLIN SODIUM-DEXTROSE 2-4 GM/100ML-% IV SOLN
2.0000 g | INTRAVENOUS | Status: AC
  Administered 2021-10-04: 2 g via INTRAVENOUS

## 2021-10-04 MED ORDER — LACTATED RINGERS IV SOLN: INTRAVENOUS | Status: DC

## 2021-10-04 MED ORDER — FENTANYL CITRATE (PF) 100 MCG/2ML IJ SOLN: 25.0000 ug | INTRAMUSCULAR | Status: DC | PRN

## 2021-10-04 MED ORDER — ONDANSETRON HCL 4 MG/2ML IJ SOLN
INTRAMUSCULAR | Status: DC | PRN
  Administered 2021-10-04: 4 mg via INTRAVENOUS

## 2021-10-04 MED ORDER — HYDROCODONE-ACETAMINOPHEN 5-325 MG PO TABS: ORAL_TABLET | ORAL | 0 refills | Status: AC

## 2021-10-04 MED ORDER — FENTANYL CITRATE (PF) 100 MCG/2ML IJ SOLN
INTRAMUSCULAR | Status: AC
  Filled 2021-10-04: qty 2

## 2021-10-04 MED ORDER — PROPOFOL 10 MG/ML IV BOLUS
INTRAVENOUS | Status: DC | PRN
Start: 2021-10-04 — End: 2021-10-04
  Administered 2021-10-04: 150 mg via INTRAVENOUS
  Administered 2021-10-04: 50 mg via INTRAVENOUS

## 2021-10-04 MED ORDER — OXYCODONE HCL 5 MG PO TABS
ORAL_TABLET | ORAL | Status: AC
  Filled 2021-10-04: qty 1

## 2021-10-04 MED ORDER — FENTANYL CITRATE (PF) 100 MCG/2ML IJ SOLN
INTRAMUSCULAR | Status: DC | PRN
  Administered 2021-10-04: 100 ug via INTRAVENOUS

## 2021-10-04 SURGICAL SUPPLY — 69 items
APL PRP STRL LF DISP 70% ISPRP (MISCELLANEOUS) ×1
APL SKNCLS STERI-STRIP NONHPOA (GAUZE/BANDAGES/DRESSINGS) ×1
BENZOIN TINCTURE PRP APPL 2/3 (GAUZE/BANDAGES/DRESSINGS) ×1 IMPLANT
BLADE MINI RND TIP GREEN BEAV (BLADE) ×2 IMPLANT
BLADE SURG 15 STRL LF DISP TIS (BLADE) ×2 IMPLANT
BLADE SURG 15 STRL SS (BLADE) ×4
BNDG CMPR 9X4 STRL LF SNTH (GAUZE/BANDAGES/DRESSINGS) ×1
BNDG ELASTIC 2X5.8 VLCR STR LF (GAUZE/BANDAGES/DRESSINGS) IMPLANT
BNDG ELASTIC 3X5.8 VLCR STR LF (GAUZE/BANDAGES/DRESSINGS) ×1 IMPLANT
BNDG ESMARK 4X9 LF (GAUZE/BANDAGES/DRESSINGS) ×2 IMPLANT
BNDG GAUZE ELAST 4 BULKY (GAUZE/BANDAGES/DRESSINGS) ×2 IMPLANT
CATH ROBINSON RED A/P 8FR (CATHETERS) IMPLANT
CHLORAPREP W/TINT 26 (MISCELLANEOUS) ×2 IMPLANT
CORD BIPOLAR FORCEPS 12FT (ELECTRODE) ×2 IMPLANT
COVER BACK TABLE 60X90IN (DRAPES) ×2 IMPLANT
COVER MAYO STAND STRL (DRAPES) ×2 IMPLANT
CUFF TOURN SGL QUICK 18X4 (TOURNIQUET CUFF) ×1 IMPLANT
DRAPE EXTREMITY T 121X128X90 (DISPOSABLE) ×2 IMPLANT
DRAPE OEC MINIVIEW 54X84 (DRAPES) IMPLANT
DRAPE SURG 17X23 STRL (DRAPES) ×1 IMPLANT
DRSG PAD ABDOMINAL 8X10 ST (GAUZE/BANDAGES/DRESSINGS) IMPLANT
GAUZE SPONGE 4X4 12PLY STRL (GAUZE/BANDAGES/DRESSINGS) ×2 IMPLANT
GAUZE XEROFORM 1X8 LF (GAUZE/BANDAGES/DRESSINGS) ×1 IMPLANT
GLOVE SRG 8 PF TXTR STRL LF DI (GLOVE) ×1 IMPLANT
GLOVE SURG ENC MOIS LTX SZ7.5 (GLOVE) ×2 IMPLANT
GLOVE SURG ORTHO LTX SZ8 (GLOVE) ×1 IMPLANT
GLOVE SURG UNDER POLY LF SZ7 (GLOVE) ×1 IMPLANT
GLOVE SURG UNDER POLY LF SZ8 (GLOVE) ×2
GLOVE SURG UNDER POLY LF SZ8.5 (GLOVE) ×1 IMPLANT
GOWN STRL REUS W/ TWL LRG LVL3 (GOWN DISPOSABLE) ×1 IMPLANT
GOWN STRL REUS W/TWL LRG LVL3 (GOWN DISPOSABLE) ×2
GOWN STRL REUS W/TWL XL LVL3 (GOWN DISPOSABLE) ×3 IMPLANT
K-WIRE .035X4 (WIRE) IMPLANT
NDL HYPO 25X1 1.5 SAFETY (NEEDLE) IMPLANT
NDL KEITH (NEEDLE) IMPLANT
NDL SAFETY ECLIPSE 18X1.5 (NEEDLE) IMPLANT
NEEDLE HYPO 18GX1.5 SHARP (NEEDLE)
NEEDLE HYPO 25X1 1.5 SAFETY (NEEDLE) ×2 IMPLANT
NEEDLE KEITH (NEEDLE) IMPLANT
NS IRRIG 1000ML POUR BTL (IV SOLUTION) ×2 IMPLANT
PACK BASIN DAY SURGERY FS (CUSTOM PROCEDURE TRAY) ×2 IMPLANT
PAD CAST 3X4 CTTN HI CHSV (CAST SUPPLIES) ×1 IMPLANT
PAD CAST 4YDX4 CTTN HI CHSV (CAST SUPPLIES) IMPLANT
PADDING CAST ABS 4INX4YD NS (CAST SUPPLIES)
PADDING CAST ABS COTTON 4X4 ST (CAST SUPPLIES) ×1 IMPLANT
PADDING CAST COTTON 3X4 STRL (CAST SUPPLIES) ×2
PADDING CAST COTTON 4X4 STRL (CAST SUPPLIES)
PASSER SUT SWANSON 36MM LOOP (INSTRUMENTS) IMPLANT
SLEEVE SCD COMPRESS KNEE MED (STOCKING) ×1 IMPLANT
SPIKE FLUID TRANSFER (MISCELLANEOUS) IMPLANT
SPLINT PLASTER CAST XFAST 3X15 (CAST SUPPLIES) ×1 IMPLANT
SPLINT PLASTER XTRA FASTSET 3X (CAST SUPPLIES) ×20
STOCKINETTE 4X48 STRL (DRAPES) ×2 IMPLANT
STRIP CLOSURE SKIN 1/2X4 (GAUZE/BANDAGES/DRESSINGS) ×1 IMPLANT
SUT ETHIBOND 3-0 V-5 (SUTURE) IMPLANT
SUT ETHILON 3 0 PS 1 (SUTURE) IMPLANT
SUT ETHILON 4 0 PS 2 18 (SUTURE) ×1 IMPLANT
SUT FIBERWIRE 2-0 18 17.9 3/8 (SUTURE)
SUT MERSILENE 2.0 SH NDLE (SUTURE) IMPLANT
SUT MERSILENE 4 0 P 3 (SUTURE) ×1 IMPLANT
SUT MNCRL AB 4-0 PS2 18 (SUTURE) ×1 IMPLANT
SUT SILK 4 0 PS 2 (SUTURE) IMPLANT
SUT VICRYL 0 SH 27 (SUTURE) IMPLANT
SUT VICRYL 4-0 PS2 18IN ABS (SUTURE) ×1 IMPLANT
SUTURE FIBERWR 2-0 18 17.9 3/8 (SUTURE) IMPLANT
SYR BULB EAR ULCER 3OZ GRN STR (SYRINGE) ×2 IMPLANT
SYR CONTROL 10ML LL (SYRINGE) ×1 IMPLANT
TOWEL GREEN STERILE FF (TOWEL DISPOSABLE) ×4 IMPLANT
UNDERPAD 30X36 HEAVY ABSORB (UNDERPADS AND DIAPERS) ×2 IMPLANT

## 2021-10-04 NOTE — Op Note (Signed)
I assisted Surgeon(s) and Role:    * Leanora Cover, MD - Primary    * Daryll Brod, MD - Assisting on the Procedure(s): LEFT INDEX FINGER [place] on 10/04/2021.  I provided assistance on this case as follows: setup, approach, identification of the injury, harvest of the reconstruction tendon and reconstruction of the rupture, closure of the wound and application of the dressing and splints.  Electronically signed by: Daryll Brod, MD Date: 10/04/2021 Time: 10:31 AM

## 2021-10-04 NOTE — Anesthesia Procedure Notes (Signed)
Procedure Name: LMA Insertion Date/Time: 10/04/2021 10:40 AM Performed by: Verita Lamb, CRNA Pre-anesthesia Checklist: Patient identified, Emergency Drugs available, Suction available and Patient being monitored Patient Re-evaluated:Patient Re-evaluated prior to induction Oxygen Delivery Method: Circle system utilized Preoxygenation: Pre-oxygenation with 100% oxygen Induction Type: IV induction Ventilation: Mask ventilation without difficulty LMA: LMA inserted LMA Size: 4.0 Number of attempts: 1 Airway Equipment and Method: Bite block Placement Confirmation: positive ETCO2, CO2 detector and breath sounds checked- equal and bilateral Tube secured with: Tape Dental Injury: Teeth and Oropharynx as per pre-operative assessment

## 2021-10-04 NOTE — Transfer of Care (Signed)
Immediate Anesthesia Transfer of Care Note  Patient: Pamela Pace  Procedure(s) Performed: LEFT INDEX FINGER SAGITTAL BAND REPAIR VERSUS RECONSTRUCTION (Left: Thumb)  Patient Location: PACU  Anesthesia Type:General  Level of Consciousness: awake, alert  and oriented  Airway & Oxygen Therapy: Patient Spontanous Breathing and Patient connected to face mask oxygen  Post-op Assessment: Report given to RN and Post -op Vital signs reviewed and stable  Post vital signs: Reviewed and stable  Last Vitals:  Vitals Value Taken Time  BP 129/71 10/04/21 1038  Temp    Pulse 106 10/04/21 1039  Resp 11 10/04/21 1039  SpO2 100 % 10/04/21 1039  Vitals shown include unvalidated device data.  Last Pain:  Vitals:   10/04/21 0801  TempSrc: Oral  PainSc: 7          Complications: No notable events documented.

## 2021-10-04 NOTE — Op Note (Signed)
NAME: Pamela Pace MEDICAL RECORD NO: [id number] DATE OF BIRTH: Mar 14, 1976 FACILITY: [facility] LOCATION: [facility] PHYSICIAN: Tennis Must, MD   OPERATIVE REPORT   DATE OF PROCEDURE: 10/04/21    PREOPERATIVE DIAGNOSIS: Left index finger radial sagittal band rupture   POSTOPERATIVE DIAGNOSIS: Left index finger radial sagittal band rupture   PROCEDURE: Left index finger extensor tendon centralization   SURGEON:  Leanora Cover, M.D.   ASSISTANT: Daryll Brod, MD   ANESTHESIA:  General   INTRAVENOUS FLUIDS:  Per anesthesia flow sheet.   ESTIMATED BLOOD LOSS:  Minimal.   COMPLICATIONS:  None.   SPECIMENS:  none   TOURNIQUET TIME:    Total Tourniquet Time Documented: Upper Arm (Left) - 30 minutes Total: Upper Arm (Left) - 30 minutes    DISPOSITION:  Stable to PACU.   INDICATIONS: 46 year old female with left index finger sagittal band rupture.  She is tried treating this with splinting without lasting relief.  She wishes to proceed with left index finger sagittal band repair versus reconstruction.  Risks, benefits and alternatives of surgery were discussed including the risks of blood loss, infection, damage to nerves, vessels, tendons, ligaments, bone for surgery, need for additional surgery, complications with wound healing, continued pain, stiffness.  She voiced understanding of these risks and elected to proceed.  OPERATIVE COURSE:  After being identified preoperatively by myself,  the patient and I agreed on the procedure and site of the procedure.  The surgical site was marked.  Surgical consent had been signed. She was given IV antibiotics as preoperative antibiotic prophylaxis. She was transferred to the operating room and placed on the operating table in supine position with the Left upper extremity on an arm board.  General anesthesia was induced by the anesthesiologist.  Left upper extremity was prepped and draped in normal sterile orthopedic fashion.  A  surgical pause was performed between the surgeons, anesthesia, and operating room staff and all were in agreement as to the patient, procedure, and site of procedure.  Tourniquet at the proximal aspect of the extremity was inflated to 250 mmHg after exsanguination of the arm with an Esmarch bandage.  Incision was made over the dorsum of the MP joint of the left index finger.  This is carried into subcutaneous tissue and technique.  The extensor hood was visualized.  The extensor tendons would subluxate toward the ulnar side with full flexion of the finger.  The sagittal band fibers on the radial side within.  A slip of EIP tendon from the ulnar side was taken.  This is approximate one third of the width of the EIP tendon.  This created a distally based tendon flap.  This was placed through the EIP tendon through the [facility] tendon.  The abductor insertion was identified at the radial side of the index finger the EIP tendon slip was placed through the abductor tendon insertion.  This was secured with 4-0 Mersilene in a figure-of-eight fashion.  It was then sewn back onto itself and again secured with a 4-0 M[place]ilene.  If adequately centralize the extensor tendons over the MP joint.  The wound was copiously irrigated with sterile saline.  Inverted interrupted 4-0 Vicryl sutures were placed in subcutaneous tissues and the skin was closed with a running subcuticular 4-0 Monocryl suture.  This was augmented with benzoin and Steri-Strips.  The wound was injected with core percent plain Marcaine to aid in postoperative analgesia.  It was dressed with sterile 4 x 4's and wrapped with  a Kerlix bandage.  Volar dorsal slab splint including the index long ring and small fingers was placed with the MPs flexed and the MPs extended.  This was wrapped with Kerlix and Ace bandage.  The tourniquet was deflated at 30 minutes.  Fingertips were pink with brisk capillary refill after deflation of tourniquet.  The operative  drapes were  broken down.  The patient was awoken from anesthesia safely.  She was transferred back to the stretcher and taken to PACU in stable condition.  I will see her back in the office in 1 week for postoperative followup.  I will give her a prescription for Norco 5/325 1-2 tabs PO q6 hours prn pain, dispense # 20.   Leanora Cover, MD Electronically signed, 10/04/21

## 2021-10-04 NOTE — Anesthesia Postprocedure Evaluation (Signed)
Anesthesia Post Note  Patient: Pamela Pace  Procedure(s) Performed: LEFT INDEX FINGER EXTENSOR TENDON CENTRALIZATION (Left: Thumb)     Patient location during evaluation: PACU Anesthesia Type: General Level of consciousness: awake and alert Pain management: pain level controlled Vital Signs Assessment: post-procedure vital signs reviewed and stable Respiratory status: spontaneous breathing, nonlabored ventilation and respiratory function stable Cardiovascular status: blood pressure returned to baseline and stable Postop Assessment: no apparent nausea or vomiting Anesthetic complications: no   No notable events documented.  Last Vitals:  Vitals:   10/04/21 1100 10/04/21 1115  BP: 125/77 132/77  Pulse: 83 79  Resp: 12 12  Temp:  36.4 C  SpO2: 99% 97%    Last Pain:  Vitals:   10/04/21 1115  TempSrc:   PainSc: [place]

## 2021-10-04 NOTE — H&P (Signed)
°  Pamela Pace is an 46 y.o. female.   Chief Complaint: sagittal band tear HPI: 46 yo female with left index finger sagittal band injury.  She has tried splinting without adequate healing.  She wishes to proceed with operative sagittal band repair vs reconstruction.  Allergies:  Allergies  Allergen Reactions   Shellfish Allergy Anaphylaxis and Nausea And Vomiting    Throat closes up Throat closes up   Hydromorphone Hcl Itching   Sulfa Antibiotics    Dilaudid [Hydromorphone Hcl] Hives and Itching    Past Medical History:  Diagnosis Date   Anemia    Anxiety    Depression    Fibroids 2012   Hypertension    Tobacco abuse 04/08/2017    Past Surgical History:  Procedure Laterality Date   CESAREAN SECTION  2008   IR GENERIC HISTORICAL  10/04/2014   IR RADIOLOGIST EVAL & MGMT 10/04/2014 Marybelle Killings, MD [facility] INTERV RAD: Uterine fibroid embolization    Family History: Family History  Problem Relation Age of Onset   Heart disease Father        ischemic CHF   Hypertension Father    Hyperlipidemia Father    Hypertension Brother    Lupus Sister    Hypertension Brother    Hypertension Brother    Drug abuse Brother        prediabetes   Hypertension Brother    Hypertension Brother    Asthma Daughter     Social History:   reports that she has been smoking cigarettes. She has a 26.00 pack-year smoking history. She has never used smokeless tobacco. She reports that she does not drink alcohol and does not use drugs.  Medications: Medications Prior to Admission  Medication Sig Dispense Refill   amitriptyline (ELAVIL) 25 MG tablet Take 25 mg by mouth at bedtime.     lisinopril-hydrochlorothiazide (ZESTORETIC) 10-12.5 MG tablet Take 1 tablet by mouth daily.     Multiple Vitamin (MULTIVITAMIN WITH MINERALS) TABS tablet Take 1 tablet by mouth daily.     nicotine polacrilex (NICORETTE) 2 MG gum Take 2 mg by mouth as needed for smoking cessation.      Results for orders placed or  performed during the hospital encounter of 10/04/21 (from the past 48 hour(s))  Pregnancy, urine POC     Status: None   Collection Time: 10/04/21  7:50 AM  Result Value Ref Range   Preg Test, Ur NEGATIVE NEGATIVE    Comment:        THE SENSITIVITY OF THIS METHODOLOGY IS >24 mIU/mL     No results found.    Blood pressure 127/84, pulse 83, temperature 98 F (36.7 C), temperature source Oral, resp. rate 16, height 5\' 3"  (1.6 m), weight 70.1 kg, last menstrual period 09/21/2021, SpO2 100 %.  General appearance: alert, cooperative, and appears stated age Head: Normocephalic, without obvious abnormality, atraumatic Neck: supple, symmetrical, trachea midline Extremities: Intact sensation and capillary refill all digits.  +epl/fpl/io.  No wounds.  Pulses: 2+ and symmetric Skin: Skin color, texture, turgor normal. No rashes or lesions Neurologic: Grossly normal Incision/Wound: none  Assessment/Plan Left index finger sagittal band rupture.  Plan sagittal band repair vs reconstruction.  Non operative and operative treatment options have been discussed with the patient and patient wishes to proceed with operative treatment. Risks, benefits, and alternatives of surgery have been discussed and the patient agrees with the plan of care.   Leanora Cover 10/04/2021, 8:37 AM

## 2021-10-04 NOTE — Anesthesia Preprocedure Evaluation (Signed)
Anesthesia Evaluation  Patient identified by MRN, date of birth, ID band Patient awake    Reviewed: Allergy & Precautions, NPO status , Patient's Chart, lab work & pertinent test results  Airway Mallampati: II  TM Distance: >3 FB Neck ROM: Full    Dental no notable dental hx.    Pulmonary neg pulmonary ROS, Current Smoker and Patient abstained from smoking.,    Pulmonary exam normal breath sounds clear to auscultation       Cardiovascular hypertension, Pt. on medications negative cardio ROS Normal cardiovascular exam Rhythm:Regular Rate:Normal     Neuro/Psych Anxiety Depression negative neurological ROS  negative psych ROS   GI/Hepatic negative GI ROS, Neg liver ROS,   Endo/Other  negative endocrine ROS  Renal/GU negative Renal ROS  negative genitourinary   Musculoskeletal negative musculoskeletal ROS (+)   Abdominal   Peds negative pediatric ROS (+)  Hematology negative hematology ROS (+)   Anesthesia Other Findings   Reproductive/Obstetrics negative OB ROS                             Anesthesia Physical Anesthesia Plan  ASA: 2  Anesthesia Plan: General   Post-op Pain Management:    Induction: Intravenous  PONV Risk Score and Plan: 2 and Ondansetron, Midazolam and Treatment may vary due to age or medical condition  Airway Management Planned: LMA  Additional Equipment:   Intra-op Plan:   Post-operative Plan: Extubation in OR  Informed Consent: I have reviewed the patients History and Physical, chart, labs and discussed the procedure including the risks, benefits and alternatives for the proposed anesthesia with the patient or authorized representative who has indicated his/her understanding and acceptance.     Dental advisory given  Plan Discussed with: CRNA  Anesthesia Plan Comments:         Anesthesia Quick Evaluation

## 2021-10-04 NOTE — Discharge Instructions (Addendum)

## 2021-10-05 ENCOUNTER — Encounter (HOSPITAL_BASED_OUTPATIENT_CLINIC_OR_DEPARTMENT_OTHER): Payer: Self-pay | Admitting: Orthopedic Surgery

## 2022-05-22 LAB — HM MAMMOGRAPHY

## 2022-06-02 LAB — COLOGUARD: Cologuard: NEGATIVE

## 2022-08-02 ENCOUNTER — Telehealth: Payer: Self-pay

## 2022-08-07 ENCOUNTER — Other Ambulatory Visit: Payer: Self-pay

## 2022-08-08 ENCOUNTER — Ambulatory Visit: Payer: Medicaid Other | Admitting: Obstetrics and Gynecology

## 2022-10-08 ENCOUNTER — Ambulatory Visit: Payer: Medicaid Other | Admitting: Obstetrics and Gynecology

## 2023-02-21 ENCOUNTER — Encounter: Payer: Medicaid Other | Admitting: Obstetrics and Gynecology
